# Patient Record
Sex: Female | Born: 1940 | Race: Black or African American | Hispanic: No | Marital: Married | Smoking: Former smoker
Health system: Southern US, Community
[De-identification: ages and names within clinical notes are randomized; demographics above are authoritative.]

## PROBLEM LIST (undated history)

## (undated) ENCOUNTER — Emergency Department: Admission: EM | Discharge status: Absent without leave | Payer: Medicare Other

## (undated) DIAGNOSIS — K5790 Diverticulosis of intestine, part unspecified, without perforation or abscess without bleeding: Secondary | ICD-10-CM

## (undated) DIAGNOSIS — C787 Secondary malignant neoplasm of liver and intrahepatic bile duct: Secondary | ICD-10-CM

## (undated) DIAGNOSIS — I10 Essential (primary) hypertension: Secondary | ICD-10-CM

## (undated) DIAGNOSIS — K7689 Other specified diseases of liver: Secondary | ICD-10-CM

## (undated) DIAGNOSIS — Z66 Do not resuscitate: Secondary | ICD-10-CM

## (undated) HISTORY — DX: Secondary malignant neoplasm of liver and intrahepatic bile duct: C78.7

## (undated) HISTORY — DX: Diverticulosis of intestine, part unspecified, without perforation or abscess without bleeding: K57.90

## (undated) HISTORY — PX: CATARACT EXTRACTION: SUR2

## (undated) HISTORY — PX: POLYPECTOMY: SHX149

## (undated) HISTORY — PX: ABDOMINAL HYSTERECTOMY: SHX81

## (undated) HISTORY — DX: Essential (primary) hypertension: I10

## (undated) HISTORY — DX: Do not resuscitate: Z66

## (undated) HISTORY — PX: COLONOSCOPY: SHX174

## (undated) HISTORY — DX: Other specified diseases of liver: K76.89

---

## 1963-10-23 HISTORY — PX: ECTOPIC PREGNANCY SURGERY: SHX613

## 1968-10-22 HISTORY — PX: OTHER SURGICAL HISTORY: SHX169

## 1999-08-31 ENCOUNTER — Encounter: Payer: Self-pay | Admitting: Family Medicine

## 1999-08-31 ENCOUNTER — Encounter: Admission: RE | Admit: 1999-08-31 | Discharge: 1999-08-31 | Payer: Self-pay | Admitting: Family Medicine

## 2001-10-30 ENCOUNTER — Other Ambulatory Visit: Admission: RE | Admit: 2001-10-30 | Discharge: 2001-10-30 | Payer: Self-pay | Admitting: Family Medicine

## 2002-05-12 ENCOUNTER — Encounter: Admission: RE | Admit: 2002-05-12 | Discharge: 2002-05-12 | Payer: Self-pay | Admitting: Family Medicine

## 2002-05-12 ENCOUNTER — Encounter: Payer: Self-pay | Admitting: Family Medicine

## 2002-06-23 ENCOUNTER — Encounter: Payer: Self-pay | Admitting: Emergency Medicine

## 2002-06-23 ENCOUNTER — Emergency Department (HOSPITAL_COMMUNITY): Admission: EM | Admit: 2002-06-23 | Discharge: 2002-06-24 | Payer: Self-pay | Admitting: Emergency Medicine

## 2003-09-09 ENCOUNTER — Encounter: Admission: RE | Admit: 2003-09-09 | Discharge: 2003-09-09 | Payer: Self-pay | Admitting: Orthopedic Surgery

## 2003-10-28 ENCOUNTER — Other Ambulatory Visit: Admission: RE | Admit: 2003-10-28 | Discharge: 2003-10-28 | Payer: Self-pay | Admitting: Family Medicine

## 2003-11-18 ENCOUNTER — Encounter: Admission: RE | Admit: 2003-11-18 | Discharge: 2003-11-18 | Payer: Self-pay | Admitting: Family Medicine

## 2003-12-15 ENCOUNTER — Encounter: Admission: RE | Admit: 2003-12-15 | Discharge: 2003-12-15 | Payer: Self-pay | Admitting: Family Medicine

## 2004-06-20 ENCOUNTER — Encounter: Admission: RE | Admit: 2004-06-20 | Discharge: 2004-06-20 | Payer: Self-pay | Admitting: Family Medicine

## 2005-08-15 ENCOUNTER — Other Ambulatory Visit: Admission: RE | Admit: 2005-08-15 | Discharge: 2005-08-15 | Payer: Self-pay | Admitting: Family Medicine

## 2005-10-11 ENCOUNTER — Encounter: Admission: RE | Admit: 2005-10-11 | Discharge: 2005-10-11 | Payer: Self-pay | Admitting: Family Medicine

## 2006-03-15 ENCOUNTER — Encounter: Admission: RE | Admit: 2006-03-15 | Discharge: 2006-03-15 | Payer: Self-pay | Admitting: Orthopedic Surgery

## 2006-03-20 ENCOUNTER — Encounter: Admission: RE | Admit: 2006-03-20 | Discharge: 2006-03-20 | Payer: Self-pay | Admitting: Orthopedic Surgery

## 2006-06-20 ENCOUNTER — Ambulatory Visit: Payer: Self-pay | Admitting: Internal Medicine

## 2006-07-31 ENCOUNTER — Encounter (INDEPENDENT_AMBULATORY_CARE_PROVIDER_SITE_OTHER): Payer: Self-pay | Admitting: *Deleted

## 2006-07-31 ENCOUNTER — Ambulatory Visit: Payer: Self-pay | Admitting: Internal Medicine

## 2006-10-16 ENCOUNTER — Encounter: Admission: RE | Admit: 2006-10-16 | Discharge: 2006-10-16 | Payer: Self-pay | Admitting: Family Medicine

## 2007-10-20 ENCOUNTER — Encounter: Admission: RE | Admit: 2007-10-20 | Discharge: 2007-10-20 | Payer: Self-pay | Admitting: Family Medicine

## 2008-10-20 ENCOUNTER — Encounter: Admission: RE | Admit: 2008-10-20 | Discharge: 2008-10-20 | Payer: Self-pay | Admitting: Family Medicine

## 2009-09-01 ENCOUNTER — Encounter: Admission: RE | Admit: 2009-09-01 | Discharge: 2009-09-01 | Payer: Self-pay | Admitting: Internal Medicine

## 2009-11-23 ENCOUNTER — Encounter: Admission: RE | Admit: 2009-11-23 | Discharge: 2009-11-23 | Payer: Self-pay | Admitting: Anesthesiology

## 2010-11-11 ENCOUNTER — Other Ambulatory Visit: Payer: Self-pay | Admitting: Internal Medicine

## 2010-11-11 DIAGNOSIS — Z1239 Encounter for other screening for malignant neoplasm of breast: Secondary | ICD-10-CM

## 2010-11-24 ENCOUNTER — Ambulatory Visit
Admission: RE | Admit: 2010-11-24 | Discharge: 2010-11-24 | Disposition: A | Discharge status: Home / Self Care | Payer: Federal, State, Local not specified - PPO | Source: Ambulatory Visit | Attending: Internal Medicine | Admitting: Internal Medicine

## 2010-11-24 DIAGNOSIS — Z1239 Encounter for other screening for malignant neoplasm of breast: Secondary | ICD-10-CM

## 2011-03-09 NOTE — Assessment & Plan Note (Signed)
[facility]                           GASTROENTEROLOGY OFFICE NOTE   Katherine Pham, Katherine Pham                         MRN:          [id number]  DATE:06/20/2006                            DOB:          July 10, 1941    REASON FOR EVALUATION:  Surveillance colonoscopy in an insulin requiring  diabetic.   HISTORY OF PRESENT ILLNESS:  This is a pleasant 70 year old African American  female with a history of hypertension, insulin-requiring diabetes,  hyperlipidemia, arthritis and adenomatous colon polyps who presents today  regarding surveillance colonoscopy.  She initially underwent colonoscopy in  August 2003 for family history of colon cancer in her mother.  That  examination revealed left sided diverticulosis as well as an ascending colon  polyp which was removed and found to be a tubular adenoma.  Follow up in  there years recommended.  Recall letter sent out earlier this summer to  which the patient has responded.  She has been doing well without any GI  complaints.  No abdominal pain, change in bowel habits or rectal bleeding.  Her general medical problems have been stable.   PAST MEDICAL HISTORY:  As above.   PAST SURGICAL HISTORY:  1. Tubal ligation.  2. Appendectomy.  3. Hysterectomy.  4. Breast surgery.   ALLERGIES:  SULFA.   CURRENT MEDICATIONS:  1. Hydrochlorothiazide 25 mg daily.  2. Lipitor 10 mg daily.  3. Zestril 20 mg daily.  4. Premarin 0.625 mg daily.  5. Allegra 180 mg daily as needed.  6. Humulin insulin 45 units subcu q.a.m. and q.p.m.  7. Diazepam p.r.n.   FAMILY HISTORY:  Mother with colon cancer at age 75, two sisters with colon  polyps.  Father with lung cancer.   SOCIAL HISTORY:  The patient is married with three children.  She cares for  her husband who has been ill.  She is retired from [employer].  Smokes a pack of cigarettes per day.  Does not use alcohol.   REVIEW OF SYSTEMS:  Per diagnostic evaluation  form.   PHYSICAL EXAMINATION:  GENERAL:  Well-appearing female, no acute distress.  VITAL SIGNS:  Blood pressure 130/82, heart rate 76, weight 144.2 pounds, 5  feet 4-1/2 inches in height.  HEENT:  Sclerae are anicteric.  Conjunctivae are pink.  Oral mucosa reveals  tobacco stained tongue.  LUNGS:  Rare rhonchi which are scattered.  HEART:  Regular.  ABDOMEN:  Soft without tenderness, mass or hernia.  Previous surgical  incision in the right abdomen is well-healed.  EXTREMITIES:  Without edema.   IMPRESSION:  This is a 70 year old with multiple general problems including  insulin-requiring diabetes, who presents today regarding surveillance  colonoscopy.  This, for a personal history of adenomatous colon polyps.  Also, family history of colon cancer in a first degree relative.  She is an  appropriate candidate without contraindication.   RECOMMENDATIONS:  1. Colonoscopy with polypectomy as necessary.  The nature of the procedure      as well as risks, benefits and alternatives have been reviewed.  She      understood  and agreed to proceed.  2. Hold a.m. insulin the day of the procedure.  Okay to take other      medications.  3. Ongoing general medical care with Dr. Artis Flock.                                   Wilhemina Bonito. Eda Keys., MD   JNP/MedQ  DD:  06/20/2006  DT:  06/21/2006  Job #:  [id number]   cc:   Quita Skye. Artis Flock, MD

## 2011-07-26 ENCOUNTER — Other Ambulatory Visit: Payer: Self-pay | Admitting: Internal Medicine

## 2011-07-26 DIAGNOSIS — M25552 Pain in left hip: Secondary | ICD-10-CM

## 2011-07-28 ENCOUNTER — Ambulatory Visit
Admission: RE | Admit: 2011-07-28 | Discharge: 2011-07-28 | Disposition: A | Discharge status: Home / Self Care | Payer: Federal, State, Local not specified - PPO | Source: Ambulatory Visit | Attending: Internal Medicine | Admitting: Internal Medicine

## 2011-07-28 DIAGNOSIS — M25552 Pain in left hip: Secondary | ICD-10-CM

## 2011-08-14 ENCOUNTER — Encounter: Payer: Self-pay | Admitting: Internal Medicine

## 2011-08-21 ENCOUNTER — Encounter: Payer: Self-pay | Admitting: Internal Medicine

## 2011-09-05 ENCOUNTER — Ambulatory Visit (AMBULATORY_SURGERY_CENTER): Payer: Medicare Other | Admitting: *Deleted

## 2011-09-05 VITALS — Ht 64.0 in | Wt 137.0 lb

## 2011-09-05 DIAGNOSIS — Z1211 Encounter for screening for malignant neoplasm of colon: Secondary | ICD-10-CM

## 2011-09-05 MED ORDER — MOVIPREP 100 G PO SOLR: ORAL | Status: DC

## 2011-09-26 ENCOUNTER — Ambulatory Visit (AMBULATORY_SURGERY_CENTER): Payer: Medicare Other | Admitting: Internal Medicine

## 2011-09-26 ENCOUNTER — Other Ambulatory Visit: Payer: Self-pay | Admitting: Internal Medicine

## 2011-09-26 ENCOUNTER — Encounter: Payer: Self-pay | Admitting: Internal Medicine

## 2011-09-26 VITALS — BP 168/94 | HR 89 | Temp 97.3°F | Resp 17 | Ht 64.0 in | Wt 137.0 lb

## 2011-09-26 DIAGNOSIS — D126 Benign neoplasm of colon, unspecified: Secondary | ICD-10-CM

## 2011-09-26 DIAGNOSIS — Z1211 Encounter for screening for malignant neoplasm of colon: Secondary | ICD-10-CM

## 2011-09-26 DIAGNOSIS — Z800 Family history of malignant neoplasm of digestive organs: Secondary | ICD-10-CM

## 2011-09-26 DIAGNOSIS — Z86010 Personal history of colonic polyps: Secondary | ICD-10-CM

## 2011-09-26 DIAGNOSIS — K5289 Other specified noninfective gastroenteritis and colitis: Secondary | ICD-10-CM

## 2011-09-26 MED ORDER — SODIUM CHLORIDE 0.9 % IV SOLN: 500.0000 mL | INTRAVENOUS | Status: DC

## 2011-09-26 NOTE — Patient Instructions (Signed)
Please review discharge instructions (blue and green sheets)  Await pathology  Resume your normal medications 

## 2011-09-26 NOTE — Progress Notes (Signed)
Upon arrival to [facility], pt vomited approximately 30 mL of green emesis.  Pt weepy and c/o left sided abd pain.  She is passing air.  Dr. Marina Goodell into see pt and assess.  1145- No further c/o nausea, smiling, passing air without difficulty.  No c/o pain.  Patient did not experience any of the following events: a burn prior to discharge; a fall within the facility; wrong site/side/patient/procedure/implant event; or a hospital transfer or hospital admission upon discharge from the facility. [phone])   Patient did not have preoperative order for IV antibiotic SSI prophylaxis. [phone])   Pt has no complaints at discharge.

## 2011-09-27 ENCOUNTER — Telehealth: Payer: Self-pay | Admitting: *Deleted

## 2011-09-27 NOTE — Telephone Encounter (Signed)

## 2011-10-29 ENCOUNTER — Other Ambulatory Visit: Payer: Self-pay | Admitting: Internal Medicine

## 2011-10-29 DIAGNOSIS — Z1231 Encounter for screening mammogram for malignant neoplasm of breast: Secondary | ICD-10-CM

## 2011-11-26 ENCOUNTER — Ambulatory Visit
Admission: RE | Admit: 2011-11-26 | Discharge: 2011-11-26 | Disposition: A | Discharge status: Home / Self Care | Payer: Medicare Other | Source: Ambulatory Visit | Attending: Internal Medicine | Admitting: Internal Medicine

## 2011-11-26 DIAGNOSIS — Z1231 Encounter for screening mammogram for malignant neoplasm of breast: Secondary | ICD-10-CM

## 2012-11-04 ENCOUNTER — Other Ambulatory Visit: Payer: Self-pay | Admitting: Internal Medicine

## 2012-11-04 DIAGNOSIS — Z1231 Encounter for screening mammogram for malignant neoplasm of breast: Secondary | ICD-10-CM

## 2012-12-05 ENCOUNTER — Ambulatory Visit: Payer: Medicare Other

## 2013-01-05 ENCOUNTER — Ambulatory Visit: Payer: Medicare Other

## 2013-02-05 ENCOUNTER — Ambulatory Visit
Admission: RE | Admit: 2013-02-05 | Discharge: 2013-02-05 | Disposition: A | Discharge status: Home / Self Care | Payer: Medicare Other | Source: Ambulatory Visit | Attending: Internal Medicine | Admitting: Internal Medicine

## 2013-02-05 DIAGNOSIS — Z1231 Encounter for screening mammogram for malignant neoplasm of breast: Secondary | ICD-10-CM

## 2014-01-05 ENCOUNTER — Other Ambulatory Visit: Payer: Self-pay

## 2014-01-05 DIAGNOSIS — Z1231 Encounter for screening mammogram for malignant neoplasm of breast: Secondary | ICD-10-CM

## 2014-02-08 ENCOUNTER — Ambulatory Visit
Admission: RE | Admit: 2014-02-08 | Discharge: 2014-02-08 | Disposition: A | Discharge status: Home / Self Care | Payer: Medicare Other | Source: Ambulatory Visit

## 2014-02-08 DIAGNOSIS — Z1231 Encounter for screening mammogram for malignant neoplasm of breast: Secondary | ICD-10-CM

## 2015-01-05 ENCOUNTER — Other Ambulatory Visit: Payer: Self-pay

## 2015-01-05 DIAGNOSIS — Z1231 Encounter for screening mammogram for malignant neoplasm of breast: Secondary | ICD-10-CM

## 2015-02-10 ENCOUNTER — Ambulatory Visit
Admission: RE | Admit: 2015-02-10 | Discharge: 2015-02-10 | Disposition: A | Discharge status: Home / Self Care | Payer: Medicare Other | Source: Ambulatory Visit

## 2015-02-10 DIAGNOSIS — Z1231 Encounter for screening mammogram for malignant neoplasm of breast: Secondary | ICD-10-CM

## 2015-03-29 ENCOUNTER — Encounter: Payer: Self-pay | Admitting: Internal Medicine

## 2015-10-05 ENCOUNTER — Other Ambulatory Visit: Payer: Self-pay | Admitting: Internal Medicine

## 2015-10-05 DIAGNOSIS — R918 Other nonspecific abnormal finding of lung field: Secondary | ICD-10-CM

## 2015-10-06 ENCOUNTER — Ambulatory Visit
Admission: RE | Admit: 2015-10-06 | Discharge: 2015-10-06 | Disposition: A | Discharge status: Home / Self Care | Payer: Medicare Other | Source: Ambulatory Visit | Attending: Internal Medicine | Admitting: Internal Medicine

## 2015-10-06 DIAGNOSIS — R918 Other nonspecific abnormal finding of lung field: Secondary | ICD-10-CM

## 2015-10-06 MED ORDER — IOPAMIDOL (ISOVUE-300) INJECTION 61%
75.0000 mL | Freq: Once | INTRAVENOUS | Status: AC | PRN
  Administered 2015-10-06: 75 mL via INTRAVENOUS

## 2015-10-12 ENCOUNTER — Telehealth: Payer: Self-pay | Admitting: *Deleted

## 2015-10-12 DIAGNOSIS — R918 Other nonspecific abnormal finding of lung field: Secondary | ICD-10-CM | POA: Insufficient documentation

## 2015-10-12 NOTE — Telephone Encounter (Signed)
Patient called to get clarification of appt.  She also needed an earlier time due to not being able to drive in the dark. I changed the appt and clarified with her.

## 2015-10-12 NOTE — Telephone Encounter (Signed)
Oncology Nurse Navigator Documentation  Oncology Nurse Navigator Flowsheets 10/12/2015  Referral date to RadOnc/MedOnc 10/11/2015  Navigator Encounter Type Telephone;Introductory phone call/I received referral on Katherine Pham.  I called and left her a vm message regarding appt to see Dr. Julien Nordmann on 10/20/15 arrive at cancer center at 3:30.  I also left my name and phone number to call if needed.   Patient Visit Type Initial  Treatment Phase Abnormal Scans  Interventions Coordination of Care  Coordination of Care MD Appointments  Time Spent with Patient 15

## 2015-10-19 ENCOUNTER — Telehealth: Payer: Self-pay | Admitting: *Deleted

## 2015-10-19 NOTE — Telephone Encounter (Signed)
Called pt and confirmed 10/20/15 clinic appt w/ her.

## 2015-10-20 ENCOUNTER — Telehealth: Payer: Self-pay | Admitting: Internal Medicine

## 2015-10-20 ENCOUNTER — Encounter: Payer: Self-pay | Admitting: Internal Medicine

## 2015-10-20 ENCOUNTER — Ambulatory Visit (HOSPITAL_BASED_OUTPATIENT_CLINIC_OR_DEPARTMENT_OTHER): Payer: Medicare Other | Admitting: Internal Medicine

## 2015-10-20 ENCOUNTER — Ambulatory Visit: Payer: Medicare Other | Attending: Internal Medicine | Admitting: Physical Therapy

## 2015-10-20 ENCOUNTER — Encounter: Payer: Self-pay | Admitting: *Deleted

## 2015-10-20 ENCOUNTER — Other Ambulatory Visit (HOSPITAL_BASED_OUTPATIENT_CLINIC_OR_DEPARTMENT_OTHER): Payer: Medicare Other

## 2015-10-20 VITALS — BP 146/69 | HR 98 | Temp 97.9°F | Resp 18 | Ht 64.0 in | Wt 132.2 lb

## 2015-10-20 DIAGNOSIS — E119 Type 2 diabetes mellitus without complications: Secondary | ICD-10-CM

## 2015-10-20 DIAGNOSIS — R918 Other nonspecific abnormal finding of lung field: Secondary | ICD-10-CM

## 2015-10-20 DIAGNOSIS — J449 Chronic obstructive pulmonary disease, unspecified: Secondary | ICD-10-CM | POA: Diagnosis not present

## 2015-10-20 DIAGNOSIS — I10 Essential (primary) hypertension: Secondary | ICD-10-CM

## 2015-10-20 DIAGNOSIS — R5381 Other malaise: Secondary | ICD-10-CM | POA: Diagnosis present

## 2015-10-20 DIAGNOSIS — N189 Chronic kidney disease, unspecified: Secondary | ICD-10-CM

## 2015-10-20 DIAGNOSIS — C3492 Malignant neoplasm of unspecified part of left bronchus or lung: Secondary | ICD-10-CM | POA: Diagnosis not present

## 2015-10-20 DIAGNOSIS — C787 Secondary malignant neoplasm of liver and intrahepatic bile duct: Secondary | ICD-10-CM

## 2015-10-20 DIAGNOSIS — E559 Vitamin D deficiency, unspecified: Secondary | ICD-10-CM

## 2015-10-20 DIAGNOSIS — E785 Hyperlipidemia, unspecified: Secondary | ICD-10-CM | POA: Insufficient documentation

## 2015-10-20 DIAGNOSIS — E0800 Diabetes mellitus due to underlying condition with hyperosmolarity without nonketotic hyperglycemic-hyperosmolar coma (NKHHC): Secondary | ICD-10-CM

## 2015-10-20 DIAGNOSIS — M6281 Muscle weakness (generalized): Secondary | ICD-10-CM

## 2015-10-20 HISTORY — DX: Secondary malignant neoplasm of liver and intrahepatic bile duct: C78.7

## 2015-10-20 LAB — CBC WITH DIFFERENTIAL/PLATELET
BASO%: 0.4 % (ref 0.0–2.0)
Basophils Absolute: 0 10*3/uL (ref 0.0–0.1)
EOS%: 2.6 % (ref 0.0–7.0)
Eosinophils Absolute: 0.2 10*3/uL (ref 0.0–0.5)
HCT: 45.2 % (ref 34.8–46.6)
HEMOGLOBIN: 15.1 g/dL (ref 11.6–15.9)
LYMPH#: 1.8 10*3/uL (ref 0.9–3.3)
LYMPH%: 21.9 % (ref 14.0–49.7)
MCH: 30.3 pg (ref 25.1–34.0)
MCHC: 33.4 g/dL (ref 31.5–36.0)
MCV: 90.8 fL (ref 79.5–101.0)
MONO#: 0.6 10*3/uL (ref 0.1–0.9)
MONO%: 6.9 % (ref 0.0–14.0)
NEUT#: 5.5 10*3/uL (ref 1.5–6.5)
NEUT%: 68.2 % (ref 38.4–76.8)
Platelets: 215 10*3/uL (ref 145–400)
RBC: 4.98 10*6/uL (ref 3.70–5.45)
RDW: 15.3 % — ABNORMAL HIGH (ref 11.2–14.5)
WBC: 8.1 10*3/uL (ref 3.9–10.3)

## 2015-10-20 LAB — COMPREHENSIVE METABOLIC PANEL
ALT: 24 U/L (ref 0–55)
AST: 37 U/L — AB (ref 5–34)
Albumin: 3.6 g/dL (ref 3.5–5.0)
Alkaline Phosphatase: 95 U/L (ref 40–150)
Anion Gap: 9 mEq/L (ref 3–11)
BUN: 16.7 mg/dL (ref 7.0–26.0)
CALCIUM: 10.3 mg/dL (ref 8.4–10.4)
CHLORIDE: 107 meq/L (ref 98–109)
CO2: 22 meq/L (ref 22–29)
CREATININE: 0.9 mg/dL (ref 0.6–1.1)
EGFR: 77 mL/min/{1.73_m2} — ABNORMAL LOW (ref >90)
Glucose: 200 mg/dl — ABNORMAL HIGH (ref 70–140)
Potassium: 4.3 mEq/L (ref 3.5–5.1)
Sodium: 138 mEq/L (ref 136–145)
Total Bilirubin: 0.47 mg/dL (ref 0.20–1.20)
Total Protein: 7.2 g/dL (ref 6.4–8.3)

## 2015-10-20 NOTE — Progress Notes (Signed)
[facility] Telephone:([phone]   [phone] Multidisciplinary thoracic oncology clinic  CONSULT NOTE  REFERRING PHYSICIAN: Dr. Trilby Drummer  REASON FOR CONSULTATION:  74 years old African-American female with questionable metastatic lung cancer  HPI Katherine Pham is a 74 y.o. female with long history of smoking and past medical history significant for diabetes mellitus, hypertension, chronic renal failure, dyslipidemia, COPD, vitamin D deficiency as well as allergic rhinitis. The patient was seen by her primary care physician 2 weeks ago for routine evaluation and diabetes management. During her visit she complained of 1-2 weeks of intermittent left sided chest pain. Chest x-ray was performed on 10/05/2015 and it showed a mass in the posterior segment of the left upper lobe and another mass in the anterior segment of the left upper lobe. CT scan of the chest was performed on 10/06/2015 and it showed a dominant cavitay pleural based 5 0.2 x 3.3 cm left lower lobe lung mass concerning for primary bronchogenic carcinoma. There is also a pleural-based 3.1 x 1.8 cm anterior left upper lobe lung mass and a spiculated 1.6 cm apical right upper lobe pulmonary nodule. This could represent synchronous primary bronchogenic carcinoma versus pulmonary metastasis. There was also 3 additional subcentimeter right upper lobe pulmonary nodules likely pulmonary metastasis. The patient had right paratracheal, subcarinal and AP window mediastinal lymphadenopathy in addition to bilateral hilar adenopathy, likely metastatic. There was also innumerable more than 20 liver masses with peripheral hyperenhancement consistent with liver metastasis. The patient also has a right adrenal nodule likely metastatic in nature. Dr. Noah Delaine kindly referred the patient to me today for evaluation and recommendation regarding treatment of her condition. When seen today the patient continues to have  intermittent left-sided chest pain. She has mild nonproductive cough related to postnasal drainage. She denied having any significant shortness breath or hemoptysis. No significant weight loss but she has hot flashes. She denied having any headache or visual changes. The patient has mild nausea with no vomiting, diarrhea or constipation. She complains today of sleep disturbance that has been going on for a while.  Family history significant for a father who died at age 11 with a small cell lung cancer. Mother died from colon cancer and Alzheimer. She also had a sister with metastatic disease to the brain and bone.  The patient is a widow and has 3 children. One of them had mental issues and currently lives in group home and [facility]. She has 2 daughters one lives in [place] and the other lives in [place]. She used to do office work for [employer]. She has a history of smoking 1 pack per day for around 58 years and quit last week. She denied having any history of alcohol or drug abuse.  HPI  Past Medical History  Diagnosis Date  . Hypertension   . Diabetes mellitus   . Diverticulosis     on colon-07/31/06  . Liver metastases ([place]) 10/20/2015    Past Surgical History  Procedure Laterality Date  . Abdominal hysterectomy    . Ectopic pregnancy surgery  1965  . Cataract extraction      lt eye 1996,rt eye 1996  . Breast cyst removal  1970    bilateral benign  . Colonoscopy    . Polypectomy      Family History  Problem Relation Age of Onset  . Colon cancer Mother   . Diabetes Sister   . Diabetes Brother   . Diabetes Maternal  Grandmother   . Diabetes Sister     Social History Social History  Substance Use Topics  . Smoking status: Current Some Day Smoker    Types: Cigarettes  . Smokeless tobacco: Never Used  . Alcohol Use: No    Allergies  Allergen Reactions  . Naproxen Hives  . Sulfa Antibiotics Hives  . Celebrex [Celecoxib] Other (See  Comments)    Causes stomach cramps    Current Outpatient Prescriptions  Medication Sig Dispense Refill  . amLODipine (NORVASC) 2.5 MG tablet Take 2.5 mg by mouth daily.  0  . Artificial Tear Ointment (ARTIFICIAL TEARS) ointment as needed.      . BD INSULIN SYRINGE ULTRAFINE 31G X 15/64" 0.5 ML MISC     . cetirizine (ZYRTEC) 10 MG tablet Take 10 mg by mouth as needed.      . ergocalciferol (VITAMIN D2) 50000 UNITS capsule     . estrogens, conjugated, (PREMARIN) 0.625 MG tablet Take 0.625 mg by mouth daily. Take daily for 21 days then do not take for 7 days.     . hydrochlorothiazide (HYDRODIURIL) 25 MG tablet Take 25 mg by mouth Daily.    . insulin NPH (HUMULIN N,NOVOLIN N) 100 UNIT/ML injection Inject 100 Units into the skin 2 (two) times daily before a meal. Pt reports 50units in am, 35units in PM    . lisinopril (PRINIVIL,ZESTRIL) 40 MG tablet Take 40 mg by mouth daily.      . meloxicam (MOBIC) 7.5 MG tablet 1 tablet Once a day Orally 90 days  0  . ONE TOUCH ULTRA TEST test strip     . simvastatin (ZOCOR) 10 MG tablet Take 10 mg by mouth at bedtime.      . triamcinolone cream (KENALOG) 0.1 % Apply topically 2 (two) times daily.       No current facility-administered medications for this visit.    Review of Systems  Constitutional: positive for fatigue Eyes: negative Ears, nose, mouth, throat, and face: positive for Postnasal drainage Respiratory: positive for cough and pleurisy/chest pain Cardiovascular: negative Gastrointestinal: negative Genitourinary:negative Integument/breast: negative Hematologic/lymphatic: negative Musculoskeletal:negative Neurological: negative Behavioral/Psych: negative Endocrine: negative Allergic/Immunologic: negative  Physical Exam  VEL:FYBOF, healthy, no distress, well nourished, well developed and anxious SKIN: skin color, texture, turgor are normal, no rashes or significant lesions HEAD: Normocephalic, No masses, lesions, tenderness or  abnormalities EYES: normal, PERRLA, Conjunctiva are pink and non-injected EARS: External ears normal, Canals clear OROPHARYNX:no exudate, no erythema and lips, buccal mucosa, and tongue normal  NECK: supple, no adenopathy, no JVD LYMPH:  no palpable lymphadenopathy, no hepatosplenomegaly BREAST:not examined LUNGS: prolonged expiratory phase, expiratory wheezes bilaterally HEART: regular rate & rhythm, no murmurs and no gallops ABDOMEN:abdomen soft, non-tender, normal bowel sounds and no masses or organomegaly BACK: Back symmetric, no curvature., No CVA tenderness EXTREMITIES:no joint deformities, effusion, or inflammation, no edema, no skin discoloration, no clubbing  NEURO: alert & oriented x 3 with fluent speech, no focal motor/sensory deficits  PERFORMANCE STATUS: ECOG 1  LABORATORY DATA: Lab Results  Component Value Date   WBC 8.1 10/20/2015   HGB 15.1 10/20/2015   HCT 45.2 10/20/2015   MCV 90.8 10/20/2015   PLT 215 10/20/2015      Chemistry      Component Value Date/Time   NA 138 10/20/2015 1208   K 4.3 10/20/2015 1208   CO2 22 10/20/2015 1208   BUN 16.7 10/20/2015 1208   CREATININE 0.9 10/20/2015 1208      Component Value Date/Time  CALCIUM 10.3 10/20/2015 1208   ALKPHOS 95 10/20/2015 1208   AST 37* 10/20/2015 1208   ALT 24 10/20/2015 1208   BILITOT 0.47 10/20/2015 1208       RADIOGRAPHIC STUDIES: Ct Chest W Contrast  10/06/2015  CLINICAL DATA:  Cavitary left lung mass on recent chest radiograph. Current smoker. EXAM: CT CHEST WITH CONTRAST TECHNIQUE: Multidetector CT imaging of the chest was performed during intravenous contrast administration. CONTRAST:  46m ISOVUE-300 IOPAMIDOL (ISOVUE-300) INJECTION 61% COMPARISON:  Chest radiograph from one day prior. 09/01/2009 chest CT. FINDINGS: Mediastinum/Nodes: Normal heart size. Trace pericardial fluid/thickening. Great vessels are normal in course and caliber. No central pulmonary emboli. Normal visualized thyroid.  Normal esophagus. No axillary adenopathy. There are several mildly enlarged right paratracheal nodes, largest 1.3 cm (series 2/ image 22). There is a mildly enlarged 1.1 cm subcarinal node (2/25). There is a mildly enlarged 1.3 cm prevascular left anterior mediastinal node (2/17). There is bulky aortopulmonary window lymphadenopathy, largest 2.2 cm (2/21). There is a mildly enlarged 1.2 cm left lower lobe peribronchial node (2/28). There is a mildly enlarged 1.1 cm right hilar node (2/26). Lungs/Pleura: No pneumothorax. No pleural effusion. There is a pleural-based 5.2 x 3.3 cm lobulated left lower lobe lung mass with partial internal cavitation (series 5/ image 29). There is a pleural-based lobulated 3.1 x 1.8 cm anterior left upper lobe lung mass (5/21). There is a spiculated 1.6 x 1.5 cm anterior apical right upper lobe pulmonary nodule (5/11). There are 3 additional subcentimeter solid right upper lobe pulmonary nodules, largest 0.9 cm (5/18). There is moderate centrilobular and paraseptal emphysema. No acute consolidative airspace disease. Upper abdomen: There are innumerable (> than 20) hypodense liver masses scattered throughout the visualized liver, each of which demonstrates central hypoenhancement and a thickened hyperenhancing rim, largest 2.1 cm in segment 8 of the right liver lobe (series 2/ image 46). Right adrenal 1.0 cm nodule (series 2/image 51), which appears new since 20/10. Mild irregularity of the left adrenal gland, without discrete left adrenal nodule. Musculoskeletal: No aggressive appearing focal osseous lesions. Mild degenerative changes in the thoracic spine. IMPRESSION: 1. Dominant cavitary 5.2 x 3.3 cm left lower lobe lung mass, pleural-based, most in keeping with a primary bronchogenic carcinoma. Thoracic surgical and oncology consultation are advised. PET-CT would be useful for further staging. 2. Pleural-based 3.1 x 1.8 cm anterior left upper lobe lung mass. Spiculated 1.6 cm apical  right upper lobe pulmonary nodule. These could represent synchronous primary bronchogenic carcinomas versus pulmonary metastases. 3. Three additional subcentimeter right upper lobe pulmonary nodules, likely pulmonary metastases. 4. Right paratracheal, subcarinal and AP window mediastinal lymphadenopathy and bilateral hilar lymphadenopathy, likely metastatic. 5. Innumerable (> than 20) liver masses with peripheral hyperenhancement, in keeping with liver metastases. 6. Right adrenal nodule, likely metastatic. 7. Moderate emphysema. Electronically Signed   By: JIlona SorrelM.D.   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 Tissue biopsy and further staging workup are still pending.   PLAN: I had a lengthy discussion with the patient today about her current disease stage, prognosis and treatment options. I showed the patient the images of the CT scan of the chest. I recommended for the patient to complete the staging workup by ordering a PET scan in addition to MRI  of the brain. I will also refer the patient to interventional radiology for consideration of ultrasound guided core biopsy of one of the metastatic liver lesion. We will try to send her tissue to be tested for PDL 1 The patient if it is consistent with non-small cell lung cancer and also to [facility] for molecular study if it is consistent with adenocarcinoma. I will arrange for the patient to come back for follow-up visit in 2 weeks for reevaluation and discussion of her treatment options based on the final pathology and staging workup For smoke cessation, I strongly encouraged the patient to continue quitting smoking and offered her smoke cessation program and help if needed. For malnutrition, I will refer  the patient to the dietitian at the [facility] for further evaluation and recommendation regarding management of her diet especially with her previous history of diabetes mellitus For the history of hypertension and diabetes mellitus, the patient will continue her routine follow-up visit and management by her primary care physician. I give the patient the time to ask questions and I answered them completely to her satisfaction. The patient was seen during the multidisciplinary thoracic oncology clinic today by medical oncology, physical therapist, thoracic navigator and social worker. She was advised to call immediately if she has any concerning symptoms in the interval. The patient voices understanding of current disease status and treatment options and is in agreement with the current care plan.  All questions were answered. The patient knows to call the clinic with any problems, questions or concerns. We can certainly see the patient much sooner if necessary.  Thank you so much for allowing me to participate in the care of Katherine Pham. I will continue to follow up the patient with you and assist in her care.  I spent 55 minutes counseling the patient face to face. The total time spent in the appointment was 80 minutes.  Disclaimer: This note was dictated with voice recognition software. Similar sounding words can inadvertently be transcribed and may not be corrected upon review.   Briauna Gilmartin K. October 20, 2015, 2:01 PM

## 2015-10-20 NOTE — Telephone Encounter (Signed)
Gave pt appts for 11/01/15 and advised her that radiology will call her to arrange appts for US Biopsy, Pet Scan, and MRI Brain asap.

## 2015-10-20 NOTE — Therapy (Signed)
[place], [place], [address] Phone: [phone]   Fax:  [phone]  Physical Therapy Evaluation  Patient Details  Name: AUDA FINFROCK MRN: [id number] Date of Birth: Jul 28, 1941 Referring Provider: Dr. Curt Bears  Encounter Date: 10/20/2015      PT End of Session - 10/20/15 1439    Visit Number 1   Number of Visits 1   PT Start Time 1660   PT Stop Time 1420   PT Time Calculation (min) 25 min   Activity Tolerance Patient tolerated treatment well      Past Medical History  Diagnosis Date  . Hypertension   . Diabetes mellitus   . Diverticulosis     on colon-07/31/06  . Liver metastases ([place]) 10/20/2015    Past Surgical History  Procedure Laterality Date  . Abdominal hysterectomy    . Ectopic pregnancy surgery  1965  . Cataract extraction      lt eye 1996,rt eye 1996  . Breast cyst removal  1970    bilateral benign  . Colonoscopy    . Polypectomy      There were no vitals filed for this visit.  Visit Diagnosis:  Physical deconditioning - Plan: PT plan of care cert/re-cert      Subjective Assessment - 10/20/15 1428    Subjective Patient reports being stressed today, and nervous as she signs her name.   Pertinent History Patient presented with chest pain for one week; work up 10/06/15 showed left lung mass.  No tissue diagnosis yet on left lower lobe 5 cm. mass and left upper lobe 3 cm. mass; also has mediastinal and hilar lymphadenopathy and liver metastases.  Expected to have chemotherapy.  IDDM; HTN; current smoker; right hip OA.   Patient Stated Goals get information from all lung clinic providers   Currently in Pain? No/denies   Pain Score 2   at times   Pain Location Rib cage   Pain Orientation Left   Pain Descriptors / Indicators Other (Comment)  "I'm aware of it."   Pain Frequency Intermittent   Pain Relieving Factors nothing            [facility] PT Assessment -  10/20/15 0001    Assessment   Medical Diagnosis left lung cancer, stage IV   Referring Provider Dr. Curt Bears   Onset Date/Surgical Date 10/06/15   Precautions   Precautions Other (comment)   Precaution Comments cancer precautions   Restrictions   Weight Bearing Restrictions No   Balance Screen   Has the patient fallen in the past 6 months No   Has the patient had a decrease in activity level because of a fear of falling?  No   Is the patient reluctant to leave their home because of a fear of falling?  No   Home Environment   Living Environment Private residence   Living Arrangements Alone  widow   Type of [place] One level   Prior Function   Level of Independence Independent   Leisure no current exercise program   Cognition   Overall Cognitive Status Within Functional Limits for tasks assessed   Functional Tests   Functional tests Sit to Stand   Sit to Stand   Comments 7 times in 30 seconds, below average for her age  reports some perspiration in face and fatigue in quads after   Posture/Postural Control   Posture/Postural Control No significant limitations   ROM /  Strength   AROM / PROM / Strength AROM   AROM   Overall AROM Comments standing trunk AROM WFL all motions   Ambulation/Gait   Ambulation/Gait Yes   Ambulation/Gait Assistance 7: Independent  per patient report   Assistive device None   Balance   Balance Assessed Yes   Dynamic Standing Balance   Dynamic Standing - Comments reaches 10 inches forward in standing, about average for her age           LYMPHEDEMA/ONCOLOGY QUESTIONNAIRE - 26-Oct-2015 1438    Type   Cancer Type lung, stage IV                        PT Education - 26-Oct-2015 1438    Education provided Yes   Education Details posture, breathing, energy conservation, walking, Cure article on staying active, PT info   Person(s) Educated Patient   Methods Explanation;Handout   Comprehension Verbalized  understanding               Lung Clinic Goals - 10-26-2015 1442    Patient will be able to verbalize understanding of the benefit of exercise to decrease fatigue.   Status Achieved   Patient will be able to verbalize the importance of posture.   Status Achieved   Patient will be able to demonstrate diaphragmatic breathing for improved lung function.   Status Achieved   Patient will be able to verbalize understanding of the role of physical therapy to prevent functional decline and who to contact if physical therapy is needed.   Status Achieved             Plan - 10-26-15 1440    Clinical Impression Statement Patient being diagnosed with stage IV lung cancer was attentive to information given today despite being stressed about receiving this diagnosis.  She was assured that breathing exercises can still help her, despite having lung disease.   Pt will benefit from skilled therapeutic intervention in order to improve on the following deficits Cardiopulmonary status limiting activity   Rehab Potential Good   PT Frequency One time visit   PT Treatment/Interventions Patient/family education   PT Next Visit Plan None at this time; patient may benefit from therapy going forward if she has difficulty with doing and exercise program or maintaining strength.   PT Home Exercise Plan see education section   Consulted and Agree with Plan of Care Patient          G-Codes - 10-26-2015 1443    Functional Assessment Tool Used clinical judgement   Functional Limitation Changing and maintaining body position   Changing and Maintaining Body Position Current Status [phone]) At least 20 percent but less than 40 percent impaired, limited or restricted   Changing and Maintaining Body Position Goal Status (X7353) At least 20 percent but less than 40 percent impaired, limited or restricted   Changing and Maintaining Body Position Discharge Status (G9924) At least 20 percent but less than 40 percent  impaired, limited or restricted       Problem List Patient Active Problem List   Diagnosis Date Noted  . HTN (hypertension) 2015-10-26  . DM (diabetes mellitus) ([place]) 2015/10/26  . Dyslipidemia 26-Oct-2015  . 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 Liver metastases ([place]) October 26, 2015  . Lung mass 10/12/2015    SALISBURY,DONNA 26-Oct-2015, 2:45 PM  [place], [place], [address] Phone: [phone]   Fax:  [phone]  Name: DEMA TIMMONS MRN: [id number] Date of Birth: [birth year]   [name], PT 10/20/2015 2:45 PM

## 2015-10-20 NOTE — Progress Notes (Signed)
[place] Clinical Social Work  Clinical Social Work met with patient at [facility] appointment to offer support and assess for psychosocial needs.  The patient scored a 5 on the Psychosocial Distress Thermometer which indicates moderate distress. Mrs. Digilio shared that she feels distress related to her recent cancer diagnosis, but feels she has "been blessed" in her life and compared her distress as minimal to what others may deal with.  Patient briefly shared feeling of depression she has dealt with "every so often" and relates anxiety and adjusting to illness as recent feelings since being diagnosed with cancer.  Mrs. Musso discussed the importance of her faith and shared she relies on reading the bible and her life experiences, but not a specific church/pastor.  CSW validated patients feelings and provided brief emotional support. Patient identified her long time friend from [place] to be her main emotional support and her next door neighbor as a support for practical needs such as transportation.  The patient has 2 daughters (one lives in [place] and one lives in [place]) and 1 son (he is mentally disabled and lives in group home in [place]).  She also has many siblings that live in [place].    ONCBCN DISTRESS SCREENING 10/20/2015  Screening Type Initial Screening  Distress experienced in past week (1-10) 5  Emotional problem type Depression;Nervousness/Anxiety;Adjusting to illness  Spiritual/Religous concerns type Facing my mortality  Physical Problem type Sleep/insomnia  Referral to clinical social work Yes   Clinical Social Work briefly discussed Clinical Social Work role and [employer] support programs/services.  Clinical Social Work encouraged patient to call with any additional questions or concerns.   Polo Riley, MSW, LCSW, OSW-C Clinical Social Worker [facility] [phone]

## 2015-10-20 NOTE — Progress Notes (Signed)
Oncology Nurse Navigator Documentation  Oncology Nurse Navigator Flowsheets 10/20/2015  Navigator Encounter Type Clinic/MDC/spoke with patient today.  I gave her information on next steps.  Patient will be scheduled for follow up  Patient Visit Type Initial  Treatment Phase Abnormal Scans  Barriers/Navigation Needs Education  Education Other  Time Spent with Patient 30

## 2015-10-25 ENCOUNTER — Other Ambulatory Visit: Payer: Self-pay | Admitting: Radiology

## 2015-10-25 ENCOUNTER — Telehealth: Payer: Self-pay | Admitting: Medical Oncology

## 2015-10-25 NOTE — Telephone Encounter (Signed)
I talked to pt to keep appts as scheduled.

## 2015-10-27 ENCOUNTER — Encounter (HOSPITAL_COMMUNITY): Payer: Self-pay

## 2015-10-27 ENCOUNTER — Ambulatory Visit (HOSPITAL_COMMUNITY)
Admission: RE | Admit: 2015-10-27 | Discharge: 2015-10-27 | Disposition: A | Discharge status: Home / Self Care | Payer: Medicare Other | Source: Ambulatory Visit | Attending: Internal Medicine | Admitting: Internal Medicine

## 2015-10-27 ENCOUNTER — Telehealth: Payer: Self-pay | Admitting: *Deleted

## 2015-10-27 DIAGNOSIS — R918 Other nonspecific abnormal finding of lung field: Secondary | ICD-10-CM

## 2015-10-27 DIAGNOSIS — I10 Essential (primary) hypertension: Secondary | ICD-10-CM | POA: Insufficient documentation

## 2015-10-27 DIAGNOSIS — R591 Generalized enlarged lymph nodes: Secondary | ICD-10-CM | POA: Diagnosis not present

## 2015-10-27 DIAGNOSIS — E278 Other specified disorders of adrenal gland: Secondary | ICD-10-CM | POA: Insufficient documentation

## 2015-10-27 DIAGNOSIS — K769 Liver disease, unspecified: Secondary | ICD-10-CM | POA: Diagnosis not present

## 2015-10-27 DIAGNOSIS — E785 Hyperlipidemia, unspecified: Secondary | ICD-10-CM

## 2015-10-27 DIAGNOSIS — C787 Secondary malignant neoplasm of liver and intrahepatic bile duct: Secondary | ICD-10-CM

## 2015-10-27 DIAGNOSIS — J449 Chronic obstructive pulmonary disease, unspecified: Secondary | ICD-10-CM

## 2015-10-27 DIAGNOSIS — E119 Type 2 diabetes mellitus without complications: Secondary | ICD-10-CM | POA: Diagnosis not present

## 2015-10-27 DIAGNOSIS — E0800 Diabetes mellitus due to underlying condition with hyperosmolarity without nonketotic hyperglycemic-hyperosmolar coma (NKHHC): Secondary | ICD-10-CM

## 2015-10-27 LAB — CBC WITH DIFFERENTIAL/PLATELET
BASOS ABS: 0 10*3/uL (ref 0.0–0.1)
Basophils Relative: 0 %
Eosinophils Absolute: 0.2 10*3/uL (ref 0.0–0.7)
Eosinophils Relative: 2 %
HEMATOCRIT: 44.3 % (ref 36.0–46.0)
HEMOGLOBIN: 14.5 g/dL (ref 12.0–15.0)
Lymphocytes Relative: 16 %
Lymphs Abs: 1.5 10*3/uL (ref 0.7–4.0)
MCH: 29.8 pg (ref 26.0–34.0)
MCHC: 32.7 g/dL (ref 30.0–36.0)
MCV: 91.2 fL (ref 78.0–100.0)
MONOS PCT: 9 %
Monocytes Absolute: 0.8 10*3/uL (ref 0.1–1.0)
NEUTROS ABS: 6.8 10*3/uL (ref 1.7–7.7)
NEUTROS PCT: 73 %
Platelets: 189 10*3/uL (ref 150–400)
RBC: 4.86 MIL/uL (ref 3.87–5.11)
RDW: 15.4 % (ref 11.5–15.5)
WBC: 9.4 10*3/uL (ref 4.0–10.5)

## 2015-10-27 LAB — GLUCOSE, CAPILLARY
GLUCOSE-CAPILLARY: 145 mg/dL — AB (ref 65–99)
Glucose-Capillary: 168 mg/dL — ABNORMAL HIGH (ref 65–99)

## 2015-10-27 LAB — APTT: aPTT: 29 seconds (ref 24–37)

## 2015-10-27 LAB — COMPREHENSIVE METABOLIC PANEL
ALBUMIN: 3.8 g/dL (ref 3.5–5.0)
ALK PHOS: 113 U/L (ref 38–126)
ALT: 50 U/L (ref 14–54)
AST: 64 U/L — AB (ref 15–41)
Anion gap: 14 (ref 5–15)
BILIRUBIN TOTAL: 0.8 mg/dL (ref 0.3–1.2)
BUN: 18 mg/dL (ref 6–20)
CO2: 18 mmol/L — ABNORMAL LOW (ref 22–32)
Calcium: 10.5 mg/dL — ABNORMAL HIGH (ref 8.9–10.3)
Chloride: 104 mmol/L (ref 101–111)
Creatinine, Ser: 0.6 mg/dL (ref 0.44–1.00)
GFR calc Af Amer: >60 mL/min (ref >60)
GFR calc non Af Amer: >60 mL/min (ref >60)
Glucose, Bld: 157 mg/dL — ABNORMAL HIGH (ref 65–99)
Potassium: 4.2 mmol/L (ref 3.5–5.1)
Sodium: 136 mmol/L (ref 135–145)
Total Protein: 7 g/dL (ref 6.5–8.1)

## 2015-10-27 LAB — PROTIME-INR
INR: 1.06 (ref 0.00–1.49)
Prothrombin Time: 14 seconds (ref 11.6–15.2)

## 2015-10-27 MED ORDER — PROCHLORPERAZINE MALEATE 10 MG PO TABS: 10.0000 mg | ORAL_TABLET | Freq: Four times a day (QID) | ORAL | Status: AC | PRN

## 2015-10-27 MED ORDER — SODIUM CHLORIDE 0.9 % IV SOLN
INTRAVENOUS | Status: DC
Start: 2015-10-27 — End: 2015-10-28
  Administered 2015-10-27: 12:00:00 via INTRAVENOUS

## 2015-10-27 MED ORDER — FENTANYL CITRATE (PF) 100 MCG/2ML IJ SOLN
INTRAMUSCULAR | Status: AC
  Filled 2015-10-27: qty 4

## 2015-10-27 MED ORDER — FENTANYL CITRATE (PF) 100 MCG/2ML IJ SOLN
INTRAMUSCULAR | Status: AC | PRN
  Administered 2015-10-27: 50 ug via INTRAVENOUS

## 2015-10-27 MED ORDER — MIDAZOLAM HCL 2 MG/2ML IJ SOLN
INTRAMUSCULAR | Status: AC | PRN
  Administered 2015-10-27: 1 mg via INTRAVENOUS
  Administered 2015-10-27: 0.5 mg via INTRAVENOUS
  Administered 2015-10-27: 1 mg via INTRAVENOUS

## 2015-10-27 MED ORDER — ONDANSETRON HCL 4 MG/2ML IJ SOLN
4.0000 mg | Freq: Once | INTRAMUSCULAR | Status: DC | PRN
  Filled 2015-10-27: qty 2

## 2015-10-27 MED ORDER — MIDAZOLAM HCL 2 MG/2ML IJ SOLN
INTRAMUSCULAR | Status: AC
  Filled 2015-10-27: qty 4

## 2015-10-27 NOTE — Telephone Encounter (Signed)
Call from [facility] in [place] [facility] requesting zofran order for patient to use at home.  Reported to Maudie Mercury while there in room 17 for liver biopsy she has intermittent Nausea.  Vomited yesterday.  Call [phone] to speak with patient or daughter before 5:00 pm when she is due for discharge.  After 5:00, call patient at home.

## 2015-10-27 NOTE — Telephone Encounter (Signed)
Per Julien Nordmann Compazine ordered for N/V and sent to her pharmacy.Pt notified.

## 2015-10-27 NOTE — Discharge Instructions (Signed)
Liver Biopsy The liver is a large organ in the upper right-hand side of your abdomen. A liver biopsy is a procedure in which a tissue sample is taken from the liver and examined under a microscope. The procedure is done to confirm a suspected problem. There are three types of liver biopsies:  Percutaneous. In this type, an incision is made in your abdomen. The sample is removed through the incision with a needle.  Laparoscopic. In this type, several incisions are made in the abdomen. A tiny camera is passed through one of the incisions to help guide the health care provider. The sample is removed through the other incision or incisions.  Transjugular. In this type, an incision is made in the neck. A tube is passed through the incision to the liver. The sample is removed through the tube with a needle. LET [facility] CARE PROVIDER KNOW ABOUT:  Any allergies you have.  All medicines you are taking, including vitamins, herbs, eye drops, creams, and over-the-counter medicines.  Previous problems you or members of your family have had with the use of anesthetics.  Any blood disorders you have.  Previous surgeries you have had.  Medical conditions you have.  Possibility of pregnancy, if this applies. RISKS AND COMPLICATIONS Generally, this is a safe procedure. However, problems can occur and include:  Bleeding.  Infection.  Bruising.  Collapsed lung.  Leak of digestive juices (bile) from the liver or gallbladder.  Problems with heart rhythm.  Pain at the biopsy site or in the right shoulder.  Low blood pressure (hypotension).  Injury to nearby organs or tissues. BEFORE THE PROCEDURE  Your health care provider may do some blood or urine tests. These will help your health care provider learn how well your kidneys and liver are working and how well your blood clots.  Ask your health care provider if you will be able to go home the day of the procedure. Arrange for someone to  take you home and stay with you for at least 24 hours.  Do not eat or drink anything after midnight on the night before the procedure or as directed by your health care provider.  Ask your health care provider about:  Changing or stopping your regular medicines. This is especially important if you are taking diabetes medicines or blood thinners.  Taking medicines such as aspirin and ibuprofen. These medicines can thin your blood. Do not take these medicines before your procedure if your health care provider asks you not to. PROCEDURE Regardless of the type of biopsy that will be done, you will have an IV line placed. Through this line, you will receive fluids and medicine to relax you. If you will be having a laparoscopic biopsy, you may also receive medicine through this line to make you sleep during the procedure (general anesthetic). Percutaneous Liver Biopsy  You will positioned on your back, with your right hand over your head.  A health care provider will locate your liver by tapping and pressing on the right side of your abdomen or with the help of an ultrasound machine or CT scan.  An area at the bottom of your last right rib will be numbed.  An incision will be made in the numbed area.  The biopsy needle will be inserted into the incision.  Several samples of liver tissue will be taken with the biopsy needle. You will be asked to hold your breath as each sample is taken. Laparoscopic Liver Biopsy  You will be  positioned on your back.  Several small incisions will be made in your abdomen.  Your doctor will pass a tiny camera through one incision. The camera will allow the liver to be viewed on a TV monitor in the operating room.  Tools will be passed through the other incision or incisions. These tools will be used to remove samples of liver tissue. Transjugular Liver Biopsy  You will be positioned on your back on an X-ray table, with your head turned to your left.  An  area on your neck just over your jugular vein will be numbed.  An incision will be made in the numbed area.  A tiny tube will be inserted through the incision. It will be pushed through the jugular vein to a blood vessel in the liver called the hepatic vein.  Dye will be inserted through the tube, and X-rays will be taken. The dye will make the blood vessels in the liver light up on the X-rays.  The biopsy needle will be pushed through the tube until it reaches the liver.  Samples of liver tissue will be taken with the biopsy needle.  The needle and the tube will be removed. After the samples are obtained, the incision or incisions will be closed. AFTER THE PROCEDURE  You will be taken to a recovery area.  You may have to lie on your right side for 1-2 hours. This will prevent bleeding from the biopsy site.  Your progress will be watched. Your blood pressure, pulse, and the biopsy site will be checked often.  You may have some pain or feel sick. If this happens, tell your health care provider.  As you begin to feel better, you will be offered ice and beverages.  You may be allowed to go home when the medicines have worn off and you can walk, drink, eat, and use the bathroom.   This information is not intended to replace advice given to you by your health care provider. Make sure you discuss any questions you have with your health care provider.   Document Released: 12/29/2003 Document Revised: 10/29/2014 Document Reviewed: 12/04/2013 Elsevier Interactive Patient Education 2016 Elsevier Inc. Liver Biopsy, Care After Refer to this sheet in the next few weeks. These instructions provide you with information on caring for yourself after your procedure. Your health care provider may also give you more specific instructions. Your treatment has been planned according to current medical practices, but problems sometimes occur. Call your health care provider if you have any problems or  questions after your procedure. WHAT TO EXPECT AFTER THE PROCEDURE After your procedure, it is typical to have the following:  A small amount of discomfort in the area where the biopsy was done and in the right shoulder or shoulder blade.  A small amount of bruising around the area where the biopsy was done and on the skin over the liver.  Sleepiness and fatigue for the rest of the day. HOME CARE INSTRUCTIONS   Rest at home for 1-2 days or as directed by your health care provider.  Have a friend or family member stay with you for at least 24 hours.  Because of the medicines used during the procedure, you should not do the following things in the first 24 hours:  Drive.  Use machinery.  Be responsible for the care of other people.  Sign legal documents.  Take a bath or shower.  There are many different ways to close and cover an incision, including stitches,  skin glue, and adhesive strips. Follow your health care provider's instructions on:  Incision care.  Bandage (dressing) changes and removal.  Incision closure removal.  Do not drink alcohol in the first week.  Do not lift more than 5 pounds or play contact sports for 2 weeks after this test.  Take medicines only as directed by your health care provider. Do not take medicine containing aspirin or non-steroidal anti-inflammatory medicines such as ibuprofen for 1 week after this test.  It is your responsibility to get your test results. SEEK MEDICAL CARE IF:   You have increased bleeding from an incision that results in more than a small spot of blood.  You have redness, swelling, or increasing pain in any incisions.  You notice a discharge or a bad smell coming from any of your incisions.  You have a fever or chills. SEEK IMMEDIATE MEDICAL CARE IF:   You develop swelling, bloating, or pain in your abdomen.  You become dizzy or faint.  You develop a rash.  You are nauseous or vomit.  You have difficulty  breathing, feel short of breath, or feel faint.  You develop chest pain.  You have problems with your speech or vision.  You have trouble balancing or moving your arms or legs.   This information is not intended to replace advice given to you by your health care provider. Make sure you discuss any questions you have with your health care provider.   Document Released: 04/27/2005 Document Revised: 10/29/2014 Document Reviewed: 12/04/2013 Elsevier Interactive Patient Education 2016 Elsevier Inc. Moderate Conscious Sedation, Adult Sedation is the use of medicines to promote relaxation and relieve discomfort and anxiety. Moderate conscious sedation is a type of sedation. Under moderate conscious sedation you are less alert than normal but are still able to respond to instructions or stimulation. Moderate conscious sedation is used during short medical and dental procedures. It is milder than deep sedation or general anesthesia and allows you to return to your regular activities sooner. LET [facility] CARE PROVIDER KNOW ABOUT:   Any allergies you have.  All medicines you are taking, including vitamins, herbs, eye drops, creams, and over-the-counter medicines.  Use of steroids (by mouth or creams).  Previous problems you or members of your family have had with the use of anesthetics.  Any blood disorders you have.  Previous surgeries you have had.  Medical conditions you have.  Possibility of pregnancy, if this applies.  Use of cigarettes, alcohol, or illegal drugs. RISKS AND COMPLICATIONS Generally, this is a safe procedure. However, as with any procedure, problems can occur. Possible problems include:  Oversedation.  Trouble breathing on your own. You may need to have a breathing tube until you are awake and breathing on your own.  Allergic reaction to any of the medicines used for the procedure. BEFORE THE PROCEDURE  You may have blood tests done. These tests can help show  how well your kidneys and liver are working. They can also show how well your blood clots.  A physical exam will be done.  Only take medicines as directed by your health care provider. You may need to stop taking medicines (such as blood thinners, aspirin, or nonsteroidal anti-inflammatory drugs) before the procedure.   Do not eat or drink at least 6 hours before the procedure or as directed by your health care provider.  Arrange for a responsible adult, family member, or friend to take you home after the procedure. He or she should stay with  you for at least 24 hours after the procedure, until the medicine has worn off. PROCEDURE   An intravenous (IV) catheter will be inserted into one of your veins. Medicine will be able to flow directly into your body through this catheter. You may be given medicine through this tube to help prevent pain and help you relax.  The medical or dental procedure will be done. AFTER THE PROCEDURE  You will stay in a recovery area until the medicine has worn off. Your blood pressure and pulse will be checked.   Depending on the procedure you had, you may be allowed to go home when you can tolerate liquids and your pain is under control.   This information is not intended to replace advice given to you by your health care provider. Make sure you discuss any questions you have with your health care provider.   Document Released: 07/03/2001 Document Revised: 10/29/2014 Document Reviewed: 06/15/2013 Elsevier Interactive Patient Education 2016 Elsevier Inc. Moderate Conscious Sedation, Adult, Care After Refer to this sheet in the next few weeks. These instructions provide you with information on caring for yourself after your procedure. Your health care provider may also give you more specific instructions. Your treatment has been planned according to current medical practices, but problems sometimes occur. Call your health care provider if you have any problems  or questions after your procedure. WHAT TO EXPECT AFTER THE PROCEDURE  After your procedure:  You may feel sleepy, clumsy, and have poor balance for several hours.  Vomiting may occur if you eat too soon after the procedure. HOME CARE INSTRUCTIONS  Do not participate in any activities where you could become injured for at least 24 hours. Do not:  Drive.  Swim.  Ride a bicycle.  Operate heavy machinery.  Cook.  Use power tools.  Climb ladders.  Work from a high place.  Do not make important decisions or sign legal documents until you are improved.  If you vomit, drink water, juice, or soup when you can drink without vomiting. Make sure you have little or no nausea before eating solid foods.  Only take over-the-counter or prescription medicines for pain, discomfort, or fever as directed by your health care provider.  Make sure you and your family fully understand everything about the medicines given to you, including what side effects may occur.  You should not drink alcohol, take sleeping pills, or take medicines that cause drowsiness for at least 24 hours.  If you smoke, do not smoke without supervision.  If you are feeling better, you may resume normal activities 24 hours after you were sedated.  Keep all appointments with your health care provider. SEEK MEDICAL CARE IF:  Your skin is pale or bluish in color.  You continue to feel nauseous or vomit.  Your pain is getting worse and is not helped by medicine.  You have bleeding or swelling.  You are still sleepy or feeling clumsy after 24 hours. SEEK IMMEDIATE MEDICAL CARE IF:  You develop a rash.  You have difficulty breathing.  You develop any type of allergic problem.  You have a fever. MAKE SURE YOU:  Understand these instructions.  Will watch your condition.  Will get help right away if you are not doing well or get worse.   This information is not intended to replace advice given to you by  your health care provider. Make sure you discuss any questions you have with your health care provider.   Document Released: 07/29/2013  Document Revised: 10/29/2014 Document Reviewed: 07/29/2013 Elsevier Interactive Patient Education [employer].

## 2015-10-27 NOTE — Procedures (Signed)
Interventional Radiology Procedure Note  Procedure: US guided core biopsy of left hepatic lesion.   Complications: None  Estimated Blood Loss: 0   Recommendations: - DC in 3 hrs after bedrest  Signed,  Criselda Peaches, MD

## 2015-10-27 NOTE — H&P (Signed)
Chief Complaint: Patient was seen in consultation today for ultrasound-guided liver lesion biopsy  Referring Physician(s): Mohamed,Mohamed  History of Present Illness: Katherine Pham is a 75 y.o. female , prior smoker, with recent imaging revealing bilateral lung masses , mediastinal/hilar adenopathy, liver lesions and right adrenal nodule. She presents today for ultrasound guided liver lesion biopsy for further evaluation.  Past Medical History  Diagnosis Date  . Hypertension   . Diabetes mellitus   . Diverticulosis     on colon-07/31/06  . Liver metastases ([place]) 10/20/2015    Past Surgical History  Procedure Laterality Date  . Abdominal hysterectomy    . Ectopic pregnancy surgery  1965  . Cataract extraction      lt eye 1996,rt eye 1996  . Breast cyst removal  1970    bilateral benign  . Colonoscopy    . Polypectomy      Allergies: Naproxen; Sulfa antibiotics; Aspirin; and Celebrex  Medications: Prior to Admission medications   Medication Sig Start Date End Date Taking? Authorizing Provider  ergocalciferol (VITAMIN D2) 50000 UNITS capsule  07/19/11  Yes Historical Provider, MD  hydrochlorothiazide (HYDRODIURIL) 25 MG tablet Take 25 mg by mouth Daily. 07/19/11  Yes Historical Provider, MD  terbinafine (LAMISIL) 1 % cream Apply 1 application topically 2 (two) times daily.   Yes Historical Provider, MD  amLODipine (NORVASC) 2.5 MG tablet Take 2.5 mg by mouth daily. 08/20/15   Historical Provider, MD  Artificial Tear Ointment (ARTIFICIAL TEARS) ointment as needed.      Historical Provider, MD  BD INSULIN SYRINGE ULTRAFINE 31G X 15/64" 0.5 ML MISC  06/20/11   Historical Provider, MD  cetirizine (ZYRTEC) 10 MG tablet Take 10 mg by mouth as needed.      Historical Provider, MD  estrogens, conjugated, (PREMARIN) 0.625 MG tablet Take 0.625 mg by mouth daily. Take daily for 21 days then do not take for 7 days.     Historical Provider, MD  insulin NPH (HUMULIN N,NOVOLIN N) 100  UNIT/ML injection Inject 100 Units into the skin 2 (two) times daily before a meal. Pt reports 50units in am, 35units in PM    Historical Provider, MD  lisinopril (PRINIVIL,ZESTRIL) 40 MG tablet Take 40 mg by mouth daily.      Historical Provider, MD  meloxicam (MOBIC) 7.5 MG tablet 1 tablet Once a day Orally 90 days 08/22/15   Historical Provider, MD  ONE TOUCH ULTRA TEST test strip  08/16/11   Historical Provider, MD  simvastatin (ZOCOR) 10 MG tablet Take 10 mg by mouth at bedtime.      Historical Provider, MD  triamcinolone cream (KENALOG) 0.1 % Apply topically 2 (two) times daily.      Historical Provider, MD     Family History  Problem Relation Age of Onset  . Colon cancer Mother   . Diabetes Sister   . Diabetes Brother   . Diabetes Maternal Grandmother   . Diabetes Sister     Social History   Social History  . Marital Status: Married    Spouse Name: N/A  . Number of Children: N/A  . Years of Education: N/A   Social History Main Topics  . Smoking status: Former Smoker    Types: Cigarettes    Quit date: 10/05/2015  . Smokeless tobacco: Never Used  . Alcohol Use: No  . Drug Use: No  . Sexual Activity: Not Asked   Other Topics Concern  . None   Social History Narrative  Review of Systems  Constitutional: Negative for fever and chills.  Respiratory: Negative for cough.        Occasional dyspnea with exertion  Cardiovascular: Negative for chest pain.  Gastrointestinal: Positive for nausea and vomiting. Negative for abdominal pain and blood in stool.  Genitourinary: Negative for dysuria and hematuria.  Musculoskeletal: Negative for back pain.  Neurological:       Occasional headaches    Vital Signs: BP 125/72 mmHg  Pulse 106  Temp(Src) 98.4 F (36.9 C) (Oral)  Resp 16  SpO2 100%  Physical Exam  Constitutional: She is oriented to person, place, and time. She appears well-developed and well-nourished.  Cardiovascular: Regular rhythm.   Sl tachy    Pulmonary/Chest: Effort normal.  Few right basilar crackles, left clear  Abdominal: Soft. Bowel sounds are normal. There is no tenderness.  Musculoskeletal: Normal range of motion. She exhibits no edema.  Neurological: She is alert and oriented to person, place, and time.    Mallampati Score:     Imaging: Ct Chest W Contrast  10/06/2015  CLINICAL DATA:  Cavitary left lung mass on recent chest radiograph. Current smoker. EXAM: CT CHEST WITH CONTRAST TECHNIQUE: Multidetector CT imaging of the chest was performed during intravenous contrast administration. CONTRAST:  36m ISOVUE-300 IOPAMIDOL (ISOVUE-300) INJECTION 61% COMPARISON:  Chest radiograph from one day prior. 09/01/2009 chest CT. FINDINGS: Mediastinum/Nodes: Normal heart size. Trace pericardial fluid/thickening. Great vessels are normal in course and caliber. No central pulmonary emboli. Normal visualized thyroid. Normal esophagus. No axillary adenopathy. There are several mildly enlarged right paratracheal nodes, largest 1.3 cm (series 2/ image 22). There is a mildly enlarged 1.1 cm subcarinal node (2/25). There is a mildly enlarged 1.3 cm prevascular left anterior mediastinal node (2/17). There is bulky aortopulmonary window lymphadenopathy, largest 2.2 cm (2/21). There is a mildly enlarged 1.2 cm left lower lobe peribronchial node (2/28). There is a mildly enlarged 1.1 cm right hilar node (2/26). Lungs/Pleura: No pneumothorax. No pleural effusion. There is a pleural-based 5.2 x 3.3 cm lobulated left lower lobe lung mass with partial internal cavitation (series 5/ image 29). There is a pleural-based lobulated 3.1 x 1.8 cm anterior left upper lobe lung mass (5/21). There is a spiculated 1.6 x 1.5 cm anterior apical right upper lobe pulmonary nodule (5/11). There are 3 additional subcentimeter solid right upper lobe pulmonary nodules, largest 0.9 cm (5/18). There is moderate centrilobular and paraseptal emphysema. No acute consolidative  airspace disease. Upper abdomen: There are innumerable (> than 20) hypodense liver masses scattered throughout the visualized liver, each of which demonstrates central hypoenhancement and a thickened hyperenhancing rim, largest 2.1 cm in segment 8 of the right liver lobe (series 2/ image 46). Right adrenal 1.0 cm nodule (series 2/image 51), which appears new since 20/10. Mild irregularity of the left adrenal gland, without discrete left adrenal nodule. Musculoskeletal: No aggressive appearing focal osseous lesions. Mild degenerative changes in the thoracic spine. IMPRESSION: 1. Dominant cavitary 5.2 x 3.3 cm left lower lobe lung mass, pleural-based, most in keeping with a primary bronchogenic carcinoma. Thoracic surgical and oncology consultation are advised. PET-CT would be useful for further staging. 2. Pleural-based 3.1 x 1.8 cm anterior left upper lobe lung mass. Spiculated 1.6 cm apical right upper lobe pulmonary nodule. These could represent synchronous primary bronchogenic carcinomas versus pulmonary metastases. 3. Three additional subcentimeter right upper lobe pulmonary nodules, likely pulmonary metastases. 4. Right paratracheal, subcarinal and AP window mediastinal lymphadenopathy and bilateral hilar lymphadenopathy, likely metastatic. 5. Innumerable (> than  20) liver masses with peripheral hyperenhancement, in keeping with liver metastases. 6. Right adrenal nodule, likely metastatic. 7. Moderate emphysema. Electronically Signed   By: Ilona Sorrel M.D.   On: 10/06/2015 15:53    Labs:  CBC:  Recent Labs  10/20/15 1208 10/27/15 1145  WBC 8.1 9.4  HGB 15.1 14.5  HCT 45.2 44.3  PLT 215 189    COAGS:  Recent Labs  10/27/15 1145  INR 1.06  APTT 29    BMP:  Recent Labs  10/20/15 1208  NA 138  K 4.3  CO2 22  GLUCOSE 200*  BUN 16.7  CALCIUM 10.3  CREATININE 0.9    LIVER FUNCTION TESTS:  Recent Labs  10/20/15 1208  BILITOT 0.47  AST 37*  ALT 24  ALKPHOS 95  PROT 7.2   ALBUMIN 3.6    TUMOR MARKERS: No results for input(s): AFPTM, CEA, CA199, CHROMGRNA in the last 8760 hours.  Assessment and Plan: 75 y.o. female , prior smoker, with recent imaging revealing bilateral lung masses , mediastinal/hilar adenopathy, liver lesions and right adrenal nodule. She presents today for ultrasound guided liver lesion biopsy for further evaluation.Risks and benefits discussed with the patient/daughter including, but not limited to bleeding, infection, damage to adjacent structures or low yield requiring additional tests.All of the patient's questions were answered, patient is agreeable to proceed.Consent signed and in chart.      Thank you for this interesting consult.  I greatly enjoyed meeting Katherine Pham and look forward to participating in their care.  A copy of this report was sent to the requesting provider on this date.  Signed: D. Rowe Robert 10/27/2015, 12:38 PM   I spent a total of 15 minutes in face to face in clinical consultation, greater than 50% of which was counseling/coordinating care for ultrasound-guided liver lesion biopsy

## 2015-11-01 ENCOUNTER — Other Ambulatory Visit (HOSPITAL_BASED_OUTPATIENT_CLINIC_OR_DEPARTMENT_OTHER): Payer: Medicare Other

## 2015-11-01 ENCOUNTER — Encounter: Payer: Self-pay | Admitting: *Deleted

## 2015-11-01 ENCOUNTER — Telehealth: Payer: Self-pay | Admitting: Internal Medicine

## 2015-11-01 ENCOUNTER — Encounter: Payer: Self-pay | Admitting: Internal Medicine

## 2015-11-01 ENCOUNTER — Ambulatory Visit: Payer: Medicare Other | Admitting: Nutrition

## 2015-11-01 ENCOUNTER — Ambulatory Visit (HOSPITAL_BASED_OUTPATIENT_CLINIC_OR_DEPARTMENT_OTHER): Payer: Medicare Other | Admitting: Internal Medicine

## 2015-11-01 VITALS — BP 118/56 | HR 115 | Temp 97.6°F | Resp 17 | Ht 64.0 in | Wt 130.2 lb

## 2015-11-01 DIAGNOSIS — R918 Other nonspecific abnormal finding of lung field: Secondary | ICD-10-CM

## 2015-11-01 DIAGNOSIS — C7970 Secondary malignant neoplasm of unspecified adrenal gland: Secondary | ICD-10-CM | POA: Diagnosis not present

## 2015-11-01 DIAGNOSIS — C801 Malignant (primary) neoplasm, unspecified: Secondary | ICD-10-CM

## 2015-11-01 DIAGNOSIS — C787 Secondary malignant neoplasm of liver and intrahepatic bile duct: Secondary | ICD-10-CM | POA: Diagnosis not present

## 2015-11-01 DIAGNOSIS — I10 Essential (primary) hypertension: Secondary | ICD-10-CM

## 2015-11-01 DIAGNOSIS — E785 Hyperlipidemia, unspecified: Secondary | ICD-10-CM

## 2015-11-01 DIAGNOSIS — E0800 Diabetes mellitus due to underlying condition with hyperosmolarity without nonketotic hyperglycemic-hyperosmolar coma (NKHHC): Secondary | ICD-10-CM

## 2015-11-01 DIAGNOSIS — J449 Chronic obstructive pulmonary disease, unspecified: Secondary | ICD-10-CM

## 2015-11-01 LAB — COMPREHENSIVE METABOLIC PANEL
ALT: 54 U/L (ref 0–55)
AST: 75 U/L — AB (ref 5–34)
Albumin: 3.5 g/dL (ref 3.5–5.0)
Alkaline Phosphatase: 146 U/L (ref 40–150)
Anion Gap: 12 mEq/L — ABNORMAL HIGH (ref 3–11)
BUN: 22.3 mg/dL (ref 7.0–26.0)
CHLORIDE: 105 meq/L (ref 98–109)
CO2: 19 meq/L — AB (ref 22–29)
CREATININE: 1.1 mg/dL (ref 0.6–1.1)
Calcium: 10.9 mg/dL — ABNORMAL HIGH (ref 8.4–10.4)
EGFR: 58 mL/min/{1.73_m2} — ABNORMAL LOW (ref >90)
GLUCOSE: 160 mg/dL — AB (ref 70–140)
Potassium: 4.1 mEq/L (ref 3.5–5.1)
Sodium: 137 mEq/L (ref 136–145)
TOTAL PROTEIN: 7.2 g/dL (ref 6.4–8.3)
Total Bilirubin: 0.79 mg/dL (ref 0.20–1.20)

## 2015-11-01 LAB — CBC WITH DIFFERENTIAL/PLATELET
BASO%: 0.6 % (ref 0.0–2.0)
Basophils Absolute: 0.1 10*3/uL (ref 0.0–0.1)
EOS%: 1.2 % (ref 0.0–7.0)
Eosinophils Absolute: 0.1 10*3/uL (ref 0.0–0.5)
HEMATOCRIT: 47.9 % — AB (ref 34.8–46.6)
HGB: 15.7 g/dL (ref 11.6–15.9)
LYMPH#: 1.4 10*3/uL (ref 0.9–3.3)
LYMPH%: 15.2 % (ref 14.0–49.7)
MCH: 29.7 pg (ref 25.1–34.0)
MCHC: 32.8 g/dL (ref 31.5–36.0)
MCV: 90.5 fL (ref 79.5–101.0)
MONO#: 0.8 10*3/uL (ref 0.1–0.9)
MONO%: 9 % (ref 0.0–14.0)
NEUT%: 74 % (ref 38.4–76.8)
NEUTROS ABS: 6.8 10*3/uL — AB (ref 1.5–6.5)
Platelets: 194 10*3/uL (ref 145–400)
RBC: 5.29 10*6/uL (ref 3.70–5.45)
RDW: 15.5 % — ABNORMAL HIGH (ref 11.2–14.5)
WBC: 9.2 10*3/uL (ref 3.9–10.3)

## 2015-11-01 NOTE — Progress Notes (Signed)
75 year old female diagnosed with lung cancer with liver metastases.  She is a patient of Dr. Julien Nordmann.  Past medical history includes hypertension, diabetes, diverticulosis, and tobacco.  Medications include insulin and Zocor.  Labs include glucose 200 on December 29.  Height: 64 inches. Weight: 130.2 pounds. Usual body weight: 136-137 pounds per patient. BMI: 22.34.  Patient reports mild nausea typically, on a daily basis.  She states nausea medication is helpful. Reports her appetite comes and goes; she forces herself to eat. She usually consumes 3 meals daily with an occasional bedtime snack. Patient appears to follow a no concentrated sweets diet. Denies other nutrition impact symptoms.  Nutrition diagnosis: Food and nutrition related knowledge deficit related to new diagnosis of lung cancer as evidenced by no prior need for nutrition related information.  Intervention: I educated patient to consume small frequent meals and snacks with high protein foods 6 times daily. Recommended patient try oral nutrition supplements such as Glucerna or boost glucose control. Reviewed high protein foods with patient and provided a fact sheet on increasing calories and protein. Also provided fact sheets on poor appetite and nausea vomiting. Questions were answered.  Teach back method used.  Contact information was provided.  Monitoring, evaluation, goals: Patient will tolerate adequate calories and protein to minimize weight loss.  Next visit: To be scheduled with treatment.  Patient has my contact information for further questions.  **Disclaimer: This note was dictated with voice recognition software. Similar sounding words can inadvertently be transcribed and this note may contain transcription errors which may not have been corrected upon publication of note.**

## 2015-11-01 NOTE — Telephone Encounter (Signed)
Talked to patient here in office. Scheduled appt.       [facility]. °

## 2015-11-01 NOTE — Progress Notes (Signed)
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  PRIOR THERAPY: none  CURRENT THERAPY: none  INTERVAL HISTORY: Katherine Pham 75 y.o. female returns to the clinic today for follow-up visit accompanied by her daughter. The patient is feeling fine today except for mild fatigue and shortness of breath with exertion. She also has occasional left-sided chest pain. She denied having any significant weight loss or night sweats. She has few episodes of nausea and vomiting recently. The patient had ultrasound guided core biopsy of one of the liver lesion and the final pathology (Accession: [id number]) showed poorly differentiated non-small cell carcinoma. The core biopsies are extensively involved by poorly differentiated carcinoma. Immunohistochemistry shows strong positivity with cytokeratin 7, nuclear positivity with CDX-2 and weak, patchy nuclear positivity with estrogen receptor. The tumor is negative with cytokeratin 20, cytokeratin 5/6, gross cystic disease fluid protein, progesterone receptor, Napsin-A, thyroid transcription factor-1 and mucicarmine. The immunophenotype is nonspecific and possibilities include an upper gastrointestinal primary. The patient is also scheduled for a PET scan and MRI of the brain on 11/04/2015. She is here today for evaluation and discussion of her biopsy results and further recommendation regarding her condition.  MEDICAL HISTORY: Past Medical History  Diagnosis Date  . Hypertension   . Diabetes mellitus   . Diverticulosis     on colon-07/31/06  . Liver metastases ([place])  10/20/2015    ALLERGIES:  is allergic to naproxen; sulfa antibiotics; aspirin; and celebrex.  MEDICATIONS:  Current Outpatient Prescriptions  Medication Sig Dispense Refill  . amLODipine (NORVASC) 2.5 MG tablet Take 2.5 mg by mouth daily.  0  . Artificial Tear Ointment (ARTIFICIAL TEARS) ointment as needed.      . BD INSULIN SYRINGE ULTRAFINE 31G X 15/64" 0.5 ML MISC     . cetirizine (ZYRTEC) 10 MG tablet Take 10 mg by mouth as needed.      . ergocalciferol (VITAMIN D2) 50000 UNITS capsule     . estrogens, conjugated, (PREMARIN) 0.625 MG tablet Take 0.625 mg by mouth daily. Take daily for 21 days then do not take for 7 days.     . hydrochlorothiazide (HYDRODIURIL) 25 MG tablet Take 25 mg by mouth Daily.    . insulin NPH (HUMULIN N,NOVOLIN N) 100 UNIT/ML injection Inject 100 Units into the skin 2 (two) times daily before a meal. Pt reports 50units in am, 35units in PM    . lisinopril (PRINIVIL,ZESTRIL) 40 MG tablet Take 40 mg by mouth daily.      . meloxicam (MOBIC) 7.5 MG tablet 1 tablet Once a day Orally 90 days  0  . ONE TOUCH ULTRA TEST test strip     . prochlorperazine (COMPAZINE) 10 MG tablet Take 1 tablet (10 mg total) by mouth every 6 (six) hours as needed for nausea or vomiting. 30 tablet 0  . simvastatin (ZOCOR) 10 MG tablet Take 10 mg by mouth at bedtime.      . terbinafine (LAMISIL) 1 % cream Apply 1 application topically 2 (two) times daily.    Marland Kitchen triamcinolone cream (KENALOG) 0.1 % Apply topically 2 (two) times daily.  No current facility-administered medications for this visit.    SURGICAL HISTORY:  Past Surgical History  Procedure Laterality Date  . Abdominal hysterectomy    . Ectopic pregnancy surgery  1965  . Cataract extraction      lt eye 1996,rt eye 1996  . Breast cyst removal  1970    bilateral benign  . Colonoscopy    . Polypectomy      REVIEW OF SYSTEMS:  Constitutional: positive for fatigue Eyes: negative Ears, nose, mouth, throat, and face:  negative Respiratory: positive for dyspnea on exertion and pleurisy/chest pain Cardiovascular: negative Gastrointestinal: positive for nausea and vomiting Genitourinary:negative Integument/breast: negative Hematologic/lymphatic: negative Musculoskeletal:negative Neurological: negative Behavioral/Psych: negative Endocrine: negative Allergic/Immunologic: negative   PHYSICAL EXAMINATION: General appearance: alert, cooperative, fatigued and no distress Head: Normocephalic, without obvious abnormality, atraumatic Neck: no adenopathy, no JVD, supple, symmetrical, trachea midline and thyroid not enlarged, symmetric, no tenderness/mass/nodules Lymph nodes: Cervical, supraclavicular, and axillary nodes normal. Resp: wheezes bilaterally Back: symmetric, no curvature. ROM normal. No CVA tenderness. Cardio: regular rate and rhythm, S1, S2 normal, no murmur, click, rub or gallop GI: soft, non-tender; bowel sounds normal; no masses,  no organomegaly Extremities: extremities normal, atraumatic, no cyanosis or edema Neurologic: Alert and oriented X 3, normal strength and tone. Normal symmetric reflexes. Normal coordination and gait  ECOG PERFORMANCE STATUS: 1 - Symptomatic but completely ambulatory  Blood pressure 118/56, pulse 115, temperature 97.6 F (36.4 C), temperature source Oral, resp. rate 17, height '5\' 4"'$  (1.626 m), weight 130 lb 3.2 oz (59.058 kg), SpO2 98 %.  LABORATORY DATA: Lab Results  Component Value Date   WBC 9.2 11/01/2015   HGB 15.7 11/01/2015   HCT 47.9* 11/01/2015   MCV 90.5 11/01/2015   PLT 194 11/01/2015      Chemistry      Component Value Date/Time   NA 136 10/27/2015 1145   NA 138 10/20/2015 1208   K 4.2 10/27/2015 1145   K 4.3 10/20/2015 1208   CL 104 10/27/2015 1145   CO2 18* 10/27/2015 1145   CO2 22 10/20/2015 1208   BUN 18 10/27/2015 1145   BUN 16.7 10/20/2015 1208   CREATININE 0.60 10/27/2015 1145   CREATININE 0.9 10/20/2015 1208      Component  Value Date/Time   CALCIUM 10.5* 10/27/2015 1145   CALCIUM 10.3 10/20/2015 1208   ALKPHOS 113 10/27/2015 1145   ALKPHOS 95 10/20/2015 1208   AST 64* 10/27/2015 1145   AST 37* 10/20/2015 1208   ALT 50 10/27/2015 1145   ALT 24 10/20/2015 1208   BILITOT 0.8 10/27/2015 1145   BILITOT 0.47 10/20/2015 1208       RADIOGRAPHIC STUDIES: Ct Chest W Contrast  10/06/2015  CLINICAL DATA:  Cavitary left lung mass on recent chest radiograph. Current smoker. EXAM: CT CHEST WITH CONTRAST TECHNIQUE: Multidetector CT imaging of the chest was performed during intravenous contrast administration. CONTRAST:  69m ISOVUE-300 IOPAMIDOL (ISOVUE-300) INJECTION 61% COMPARISON:  Chest radiograph from one day prior. 09/01/2009 chest CT. FINDINGS: Mediastinum/Nodes: Normal heart size. Trace pericardial fluid/thickening. Great vessels are normal in course and caliber. No central pulmonary emboli. Normal visualized thyroid. Normal esophagus. No axillary adenopathy. There are several mildly enlarged right paratracheal nodes, largest 1.3 cm (series 2/ image 22). There is a mildly enlarged 1.1 cm subcarinal node (2/25). There is a mildly enlarged 1.3 cm prevascular left anterior mediastinal node (2/17). There is bulky aortopulmonary window lymphadenopathy, largest 2.2 cm (2/21). There is a mildly enlarged 1.2 cm left lower  lobe peribronchial node (2/28). There is a mildly enlarged 1.1 cm right hilar node (2/26). Lungs/Pleura: No pneumothorax. No pleural effusion. There is a pleural-based 5.2 x 3.3 cm lobulated left lower lobe lung mass with partial internal cavitation (series 5/ image 29). There is a pleural-based lobulated 3.1 x 1.8 cm anterior left upper lobe lung mass (5/21). There is a spiculated 1.6 x 1.5 cm anterior apical right upper lobe pulmonary nodule (5/11). There are 3 additional subcentimeter solid right upper lobe pulmonary nodules, largest 0.9 cm (5/18). There is moderate centrilobular and paraseptal emphysema. No  acute consolidative airspace disease. Upper abdomen: There are innumerable (> than 20) hypodense liver masses scattered throughout the visualized liver, each of which demonstrates central hypoenhancement and a thickened hyperenhancing rim, largest 2.1 cm in segment 8 of the right liver lobe (series 2/ image 46). Right adrenal 1.0 cm nodule (series 2/image 51), which appears new since 20/10. Mild irregularity of the left adrenal gland, without discrete left adrenal nodule. Musculoskeletal: No aggressive appearing focal osseous lesions. Mild degenerative changes in the thoracic spine. IMPRESSION: 1. Dominant cavitary 5.2 x 3.3 cm left lower lobe lung mass, pleural-based, most in keeping with a primary bronchogenic carcinoma. Thoracic surgical and oncology consultation are advised. PET-CT would be useful for further staging. 2. Pleural-based 3.1 x 1.8 cm anterior left upper lobe lung mass. Spiculated 1.6 cm apical right upper lobe pulmonary nodule. These could represent synchronous primary bronchogenic carcinomas versus pulmonary metastases. 3. Three additional subcentimeter right upper lobe pulmonary nodules, likely pulmonary metastases. 4. Right paratracheal, subcarinal and AP window mediastinal lymphadenopathy and bilateral hilar lymphadenopathy, likely metastatic. 5. Innumerable (> than 20) liver masses with peripheral hyperenhancement, in keeping with liver metastases. 6. Right adrenal nodule, likely metastatic. 7. Moderate emphysema. Electronically Signed   By: Ilona Sorrel M.D.   On: 10/06/2015 15:53   [place] Core Biopsy  10/27/2015  CLINICAL DATA:  75 year old female with centrally necrotic lung mass and multiple hepatic lesions concerning for primary lung carcinoma with liver Mets. EXAM: ULTRASOUND CORE BIOPSY Date: 10/27/2015 ULTRASOUND GUIDED NEEDLE BIOPSY OF THE LIVER PROCEDURE: 1. Ultrasound-guided core biopsy metastatic liver lesion Interventional Radiologist:  Criselda Peaches, MD ANESTHESIA/SEDATION:  Moderate (conscious) sedation was used. 2.5 mg Versed, 50 mcg Fentanyl were administered intravenously. The patient's vital signs were monitored continuously by radiology nursing throughout the procedure. Sedation Time: 12 minutes MEDICATIONS: None additional TECHNIQUE: Informed consent was obtained from the patient following explanation of the procedure, risks, benefits and alternatives. The patient understands, agrees and consents for the procedure. All questions were addressed. A time out was performed. The right upper quadrant was interrogated with ultrasound. A suitable lesion in the left liver was identified. A suitable skin entry site was selected and marked. The region was then sterilely prepped and draped in standard fashion using chlorhexidine skin prep. Local anesthesia was attained by infiltration with 1% lidocaine. A small dermatotomy was made. Under real-time sonographic guidance, a 17 gauge trocar needle was advanced into the liver and positioned at the margin of the lesion. Multiple 18 gauge core biopsies were then coaxially obtained. Needle placement was confirmed on all biopsy passes with real-time sonography. Biopsy specimens were placed in formalin and delivered to pathology for further analysis. As the introducer needle was removed, the biopsy tract was embolized with a Gel-Foam slurry. Post biopsy ultrasound imaging demonstrates no active bleeding or perihepatic hematoma. The patient tolerated the procedure well. COMPLICATIONS: None. Estimated blood loss: 0 IMPRESSION: Technically successful ultrasound-guided core biopsy of  presumed metastatic liver lesion. Signed, Criselda Peaches, MD [facility] Electronically Signed   By: Jacqulynn Cadet M.D.   On: 10/27/2015 15:53    ASSESSMENT AND PLAN: This is a very pleasant 75 years old African-American female with stage IV non-small cell cancer highly suspicious for lung primary but  upper gastrointestinal malignancy could not be ruled out based on the recent pathology. I had a lengthy discussion with the patient and her daughter today about her condition. I recommended for her to have the PET scan and MRI of the brain performed on Friday as a scheduled. I will refer the patient to gastroenterology for evaluation and consideration of upper endoscopy to rule out any upper gastrointestinal primary. I would see the patient back for follow-up visit in one week for reevaluation and more detailed discussion of her treatment options based on the final staging workup and gastrointestinal evaluation. The patient was advised to call immediately if she has any concerning symptoms in the interval. The patient voices understanding of current disease status and treatment options and is in agreement with the current care plan.  All questions were answered. The patient knows to call the clinic with any problems, questions or concerns. We can certainly see the patient much sooner if necessary.  I spent 15 minutes counseling the patient face to face. The total time spent in the appointment was 25 minutes.  Disclaimer: This note was dictated with voice recognition software. Similar sounding words can inadvertently be transcribed and may not be corrected upon review.

## 2015-11-01 NOTE — Progress Notes (Signed)
Oncology Nurse Navigator Documentation  Oncology Nurse Navigator Flowsheets 11/01/2015  Navigator Location [facility]  Navigator Encounter Type Clinic/MDC/spoke with patient today at [facility].  She is doing well but has some anxiety in waiting for more results.  I listened as she explained. I states she understands she has to wait for all the information to come back. Daughter is with patient today and is supportive  Patient Visit Type MedOnc;Follow-up  Treatment Phase Abnormal Scans  Interventions Other  Time Spent with Patient 15

## 2015-11-04 ENCOUNTER — Ambulatory Visit (HOSPITAL_COMMUNITY)
Admission: RE | Admit: 2015-11-04 | Discharge: 2015-11-04 | Disposition: A | Discharge status: Home / Self Care | Payer: Medicare Other | Source: Ambulatory Visit | Attending: Internal Medicine | Admitting: Internal Medicine

## 2015-11-04 ENCOUNTER — Encounter (HOSPITAL_COMMUNITY)
Admission: RE | Admit: 2015-11-04 | Discharge: 2015-11-04 | Disposition: A | Discharge status: Home / Self Care | Payer: Medicare Other | Source: Ambulatory Visit | Attending: Internal Medicine | Admitting: Internal Medicine

## 2015-11-04 DIAGNOSIS — J449 Chronic obstructive pulmonary disease, unspecified: Secondary | ICD-10-CM | POA: Diagnosis not present

## 2015-11-04 DIAGNOSIS — C771 Secondary and unspecified malignant neoplasm of intrathoracic lymph nodes: Secondary | ICD-10-CM | POA: Insufficient documentation

## 2015-11-04 DIAGNOSIS — C3412 Malignant neoplasm of upper lobe, left bronchus or lung: Secondary | ICD-10-CM | POA: Diagnosis not present

## 2015-11-04 DIAGNOSIS — E785 Hyperlipidemia, unspecified: Secondary | ICD-10-CM | POA: Diagnosis not present

## 2015-11-04 DIAGNOSIS — R918 Other nonspecific abnormal finding of lung field: Secondary | ICD-10-CM

## 2015-11-04 DIAGNOSIS — C3411 Malignant neoplasm of upper lobe, right bronchus or lung: Secondary | ICD-10-CM | POA: Insufficient documentation

## 2015-11-04 DIAGNOSIS — C787 Secondary malignant neoplasm of liver and intrahepatic bile duct: Secondary | ICD-10-CM

## 2015-11-04 DIAGNOSIS — I10 Essential (primary) hypertension: Secondary | ICD-10-CM | POA: Diagnosis not present

## 2015-11-04 DIAGNOSIS — E042 Nontoxic multinodular goiter: Secondary | ICD-10-CM | POA: Diagnosis not present

## 2015-11-04 DIAGNOSIS — C3432 Malignant neoplasm of lower lobe, left bronchus or lung: Secondary | ICD-10-CM | POA: Diagnosis not present

## 2015-11-04 DIAGNOSIS — C3490 Malignant neoplasm of unspecified part of unspecified bronchus or lung: Secondary | ICD-10-CM | POA: Diagnosis present

## 2015-11-04 DIAGNOSIS — Z0189 Encounter for other specified special examinations: Secondary | ICD-10-CM | POA: Insufficient documentation

## 2015-11-04 DIAGNOSIS — C7951 Secondary malignant neoplasm of bone: Secondary | ICD-10-CM | POA: Insufficient documentation

## 2015-11-04 DIAGNOSIS — E0800 Diabetes mellitus due to underlying condition with hyperosmolarity without nonketotic hyperglycemic-hyperosmolar coma (NKHHC): Secondary | ICD-10-CM | POA: Insufficient documentation

## 2015-11-04 LAB — GLUCOSE, CAPILLARY: Glucose-Capillary: 146 mg/dL — ABNORMAL HIGH (ref 65–99)

## 2015-11-04 MED ORDER — FLUDEOXYGLUCOSE F - 18 (FDG) INJECTION
6.3000 | Freq: Once | INTRAVENOUS | Status: AC | PRN
  Administered 2015-11-04: 6.3 via INTRAVENOUS

## 2015-11-04 MED ORDER — GADOBENATE DIMEGLUMINE 529 MG/ML IV SOLN
15.0000 mL | Freq: Once | INTRAVENOUS | Status: AC | PRN
  Administered 2015-11-04: 12 mL via INTRAVENOUS

## 2015-11-11 ENCOUNTER — Encounter (HOSPITAL_COMMUNITY): Payer: Self-pay | Admitting: Emergency Medicine

## 2015-11-11 ENCOUNTER — Emergency Department (HOSPITAL_COMMUNITY)
Admission: EM | Admit: 2015-11-11 | Discharge: 2015-11-11 | Disposition: A | Discharge status: Home / Self Care | Payer: Medicare Other | Attending: Emergency Medicine | Admitting: Emergency Medicine

## 2015-11-11 ENCOUNTER — Other Ambulatory Visit (HOSPITAL_BASED_OUTPATIENT_CLINIC_OR_DEPARTMENT_OTHER): Payer: Medicare Other

## 2015-11-11 ENCOUNTER — Encounter: Payer: Self-pay | Admitting: Internal Medicine

## 2015-11-11 ENCOUNTER — Ambulatory Visit (HOSPITAL_BASED_OUTPATIENT_CLINIC_OR_DEPARTMENT_OTHER): Payer: Medicare Other | Admitting: Internal Medicine

## 2015-11-11 VITALS — BP 89/48 | HR 123 | Temp 98.0°F | Resp 18 | Ht 64.0 in | Wt 127.1 lb

## 2015-11-11 DIAGNOSIS — C7970 Secondary malignant neoplasm of unspecified adrenal gland: Secondary | ICD-10-CM

## 2015-11-11 DIAGNOSIS — Z8719 Personal history of other diseases of the digestive system: Secondary | ICD-10-CM | POA: Diagnosis not present

## 2015-11-11 DIAGNOSIS — R531 Weakness: Secondary | ICD-10-CM | POA: Diagnosis present

## 2015-11-11 DIAGNOSIS — Z8505 Personal history of malignant neoplasm of liver: Secondary | ICD-10-CM | POA: Diagnosis not present

## 2015-11-11 DIAGNOSIS — R630 Anorexia: Secondary | ICD-10-CM | POA: Diagnosis not present

## 2015-11-11 DIAGNOSIS — C787 Secondary malignant neoplasm of liver and intrahepatic bile duct: Secondary | ICD-10-CM

## 2015-11-11 DIAGNOSIS — E119 Type 2 diabetes mellitus without complications: Secondary | ICD-10-CM | POA: Diagnosis not present

## 2015-11-11 DIAGNOSIS — C7802 Secondary malignant neoplasm of left lung: Secondary | ICD-10-CM | POA: Diagnosis not present

## 2015-11-11 DIAGNOSIS — R627 Adult failure to thrive: Secondary | ICD-10-CM

## 2015-11-11 DIAGNOSIS — Z66 Do not resuscitate: Secondary | ICD-10-CM

## 2015-11-11 DIAGNOSIS — C7801 Secondary malignant neoplasm of right lung: Secondary | ICD-10-CM | POA: Diagnosis not present

## 2015-11-11 DIAGNOSIS — Z794 Long term (current) use of insulin: Secondary | ICD-10-CM | POA: Insufficient documentation

## 2015-11-11 DIAGNOSIS — Z79899 Other long term (current) drug therapy: Secondary | ICD-10-CM | POA: Insufficient documentation

## 2015-11-11 DIAGNOSIS — C801 Malignant (primary) neoplasm, unspecified: Secondary | ICD-10-CM

## 2015-11-11 DIAGNOSIS — R918 Other nonspecific abnormal finding of lung field: Secondary | ICD-10-CM

## 2015-11-11 DIAGNOSIS — E860 Dehydration: Secondary | ICD-10-CM | POA: Diagnosis not present

## 2015-11-11 DIAGNOSIS — D849 Immunodeficiency, unspecified: Secondary | ICD-10-CM | POA: Diagnosis not present

## 2015-11-11 DIAGNOSIS — Z7952 Long term (current) use of systemic steroids: Secondary | ICD-10-CM | POA: Insufficient documentation

## 2015-11-11 DIAGNOSIS — I10 Essential (primary) hypertension: Secondary | ICD-10-CM | POA: Insufficient documentation

## 2015-11-11 DIAGNOSIS — Z87891 Personal history of nicotine dependence: Secondary | ICD-10-CM | POA: Insufficient documentation

## 2015-11-11 DIAGNOSIS — K7689 Other specified diseases of liver: Secondary | ICD-10-CM

## 2015-11-11 HISTORY — DX: Other specified diseases of liver: K76.89

## 2015-11-11 HISTORY — DX: Do not resuscitate: Z66

## 2015-11-11 LAB — CBC WITH DIFFERENTIAL/PLATELET
BASO%: 0.7 % (ref 0.0–2.0)
Basophils Absolute: 0.1 10*3/uL (ref 0.0–0.1)
EOS%: 0.9 % (ref 0.0–7.0)
Eosinophils Absolute: 0.1 10*3/uL (ref 0.0–0.5)
HCT: 47.8 % — ABNORMAL HIGH (ref 34.8–46.6)
HEMOGLOBIN: 16 g/dL — AB (ref 11.6–15.9)
LYMPH%: 10.1 % — AB (ref 14.0–49.7)
MCH: 30.2 pg (ref 25.1–34.0)
MCHC: 33.5 g/dL (ref 31.5–36.0)
MCV: 90.1 fL (ref 79.5–101.0)
MONO#: 0.9 10*3/uL (ref 0.1–0.9)
MONO%: 8.8 % (ref 0.0–14.0)
NEUT#: 8.5 10*3/uL — ABNORMAL HIGH (ref 1.5–6.5)
NEUT%: 79.5 % — ABNORMAL HIGH (ref 38.4–76.8)
Platelets: 222 10*3/uL (ref 145–400)
RBC: 5.31 10*6/uL (ref 3.70–5.45)
RDW: 15.9 % — ABNORMAL HIGH (ref 11.2–14.5)
WBC: 10.7 10*3/uL — AB (ref 3.9–10.3)
lymph#: 1.1 10*3/uL (ref 0.9–3.3)

## 2015-11-11 LAB — COMPREHENSIVE METABOLIC PANEL
ALBUMIN: 3.3 g/dL — AB (ref 3.5–5.0)
ALT: 311 U/L (ref 0–55)
AST: 296 U/L — AB (ref 5–34)
Alkaline Phosphatase: 408 U/L — ABNORMAL HIGH (ref 40–150)
Anion Gap: 10 mEq/L (ref 3–11)
BUN: 16.5 mg/dL (ref 7.0–26.0)
CHLORIDE: 103 meq/L (ref 98–109)
CO2: 21 meq/L — AB (ref 22–29)
Calcium: 12.4 mg/dL — ABNORMAL HIGH (ref 8.4–10.4)
Creatinine: 0.7 mg/dL (ref 0.6–1.1)
GLUCOSE: 112 mg/dL (ref 70–140)
POTASSIUM: 4.9 meq/L (ref 3.5–5.1)
SODIUM: 133 meq/L — AB (ref 136–145)
Total Bilirubin: 1.11 mg/dL (ref 0.20–1.20)
Total Protein: 7 g/dL (ref 6.4–8.3)

## 2015-11-11 LAB — CBG MONITORING, ED: Glucose-Capillary: 128 mg/dL — ABNORMAL HIGH (ref 65–99)

## 2015-11-11 MED ORDER — METHYLPREDNISOLONE 4 MG PO TBPK: ORAL_TABLET | ORAL | Status: AC

## 2015-11-11 MED ORDER — SODIUM CHLORIDE 0.9 % IV BOLUS (SEPSIS)
2000.0000 mL | Freq: Once | INTRAVENOUS | Status: AC
  Administered 2015-11-11: 2000 mL via INTRAVENOUS

## 2015-11-11 NOTE — Progress Notes (Signed)
[place] Telephone:([phone]   [phone]  OFFICE PROGRESS NOTE  Roger Shelter, MD No address on file  DIAGNOSIS: Stage IV (T2b, N3, M1b) poorly Differentiated non-small cell carcinoma diagnosed in January 2000 15 and presenting with cavitary left lower lobe lung mass in addition to pleural-based left upper lobe mass and right upper lobe pulmonary nodules in addition to  Mediastinal , adrenal and liver metastasis.  PRIOR THERAPY: None  CURRENT THERAPY: None  INTERVAL HISTORY: Katherine Pham 75 y.o. female returns to the clinic today for follow-up visit accompanied by her daughter. The patient came to the clinic today in very poor condition with significant fatigue and weakness. She was unable to eat and drink for the last few days. She has nausea, lack of appetite and weight loss. She is dehydrated with low blood pressure of 89/48 and pulse 124. She denied having any significant chest pain but continues to have shortness of breath and mild cough with no hemoptysis. She had few studies performed recently including a PET scan and MRI of the brain. The PET scan was positive for hypermetabolic mass in the left lower lobe with additional hypermetabolic lesions in the left upper lobe and right upper lobe as well as bilateral mediastinal lymph node metastasis, left supraclavicular metastasis and extensive replacement of the liver with metastatic disease in addition to bone metastasis. The patient was supposed to see gastroenterology for consideration of upper endoscopy and evaluation of the primary malignancy and the final pathology was suspicious for upper gastrointestinal versus lung but she never had the appointment. She is here today with her daughter for evaluation and discussion of her treatment options.  MEDICAL HISTORY: Past Medical History  Diagnosis Date  . Hypertension   . Diabetes mellitus   . Diverticulosis     on colon-07/31/06  . Liver metastases  ([place]) 10/20/2015    ALLERGIES:  is allergic to naproxen; sulfa antibiotics; aspirin; and celebrex.  MEDICATIONS:  Current Outpatient Prescriptions  Medication Sig Dispense Refill  . amLODipine (NORVASC) 2.5 MG tablet Take 2.5 mg by mouth daily. Reported on 11/11/2015  0  . Artificial Tear Ointment (ARTIFICIAL TEARS) ointment as needed. Reported on 11/11/2015    . BD INSULIN SYRINGE ULTRAFINE 31G X 15/64" 0.5 ML MISC Reported on 11/11/2015    . cetirizine (ZYRTEC) 10 MG tablet Take 10 mg by mouth as needed. Reported on 11/11/2015    . ergocalciferol (VITAMIN D2) 50000 UNITS capsule Reported on 11/11/2015    . estrogens, conjugated, (PREMARIN) 0.625 MG tablet Take 0.625 mg by mouth daily. Reported on 11/11/2015    . hydrochlorothiazide (HYDRODIURIL) 25 MG tablet Take 25 mg by mouth Daily. Reported on 11/11/2015    . insulin NPH (HUMULIN N,NOVOLIN N) 100 UNIT/ML injection Inject 100 Units into the skin 2 (two) times daily before a meal. Reported on 11/11/2015    . lisinopril (PRINIVIL,ZESTRIL) 40 MG tablet Take 40 mg by mouth daily. Reported on 11/11/2015    . meloxicam (MOBIC) 7.5 MG tablet Reported on 11/11/2015  0  . ONE TOUCH ULTRA TEST test strip Reported on 11/11/2015    . prochlorperazine (COMPAZINE) 10 MG tablet Take 1 tablet (10 mg total) by mouth every 6 (six) hours as needed for nausea or vomiting. (Patient not taking: Reported on 11/11/2015) 30 tablet 0  . simvastatin (ZOCOR) 10 MG tablet Take 10 mg by mouth at bedtime. Reported on 11/11/2015    . terbinafine (LAMISIL) 1 % cream Apply  1 application topically 2 (two) times daily. Reported on 11/11/2015    . triamcinolone cream (KENALOG) 0.1 % Apply topically 2 (two) times daily. Reported on 11/11/2015     No current facility-administered medications for this visit.    SURGICAL HISTORY:  Past Surgical History  Procedure Laterality Date  . Abdominal hysterectomy    . Ectopic pregnancy surgery  1965  . Cataract extraction      lt eye 1996,rt  eye 1996  . Breast cyst removal  1970    bilateral benign  . Colonoscopy    . Polypectomy      REVIEW OF SYSTEMS:  Constitutional: positive for anorexia, fatigue and weight loss Eyes: negative Ears, nose, mouth, throat, and face: negative Respiratory: positive for cough and dyspnea on exertion Cardiovascular: negative Gastrointestinal: positive for abdominal pain, nausea and vomiting Genitourinary:negative Integument/breast: negative Hematologic/lymphatic: negative Musculoskeletal:negative Neurological: negative Behavioral/Psych: negative Endocrine: negative Allergic/Immunologic: negative   PHYSICAL EXAMINATION: General appearance: alert, cooperative, fatigued and no distress Head: Normocephalic, without obvious abnormality, atraumatic Neck: no adenopathy, no JVD, supple, symmetrical, trachea midline and thyroid not enlarged, symmetric, no tenderness/mass/nodules Lymph nodes: Cervical, supraclavicular, and axillary nodes normal. Resp: wheezes bilaterally Back: symmetric, no curvature. ROM normal. No CVA tenderness. Cardio: regular rate and rhythm, S1, S2 normal, no murmur, click, rub or gallop GI: soft, non-tender; bowel sounds normal; no masses,  no organomegaly Extremities: extremities normal, atraumatic, no cyanosis or edema Neurologic: Alert and oriented X 3, normal strength and tone. Normal symmetric reflexes. Normal coordination and gait  ECOG PERFORMANCE STATUS: 2 - Symptomatic, <50% confined to bed  Blood pressure 89/48, pulse 123, temperature 98 F (36.7 C), temperature source Oral, resp. rate 18, height '5\' 4"'$  (1.626 m), weight 127 lb 1.6 oz (57.652 kg), SpO2 100 %.  LABORATORY DATA: Lab Results  Component Value Date   WBC 10.7* 11/11/2015   HGB 16.0* 11/11/2015   HCT 47.8* 11/11/2015   MCV 90.1 11/11/2015   PLT 222 11/11/2015      Chemistry      Component Value Date/Time   NA 137 11/01/2015 1324   NA 136 10/27/2015 1145   K 4.1 11/01/2015 1324   K 4.2  10/27/2015 1145   CL 104 10/27/2015 1145   CO2 19* 11/01/2015 1324   CO2 18* 10/27/2015 1145   BUN 22.3 11/01/2015 1324   BUN 18 10/27/2015 1145   CREATININE 1.1 11/01/2015 1324   CREATININE 0.60 10/27/2015 1145      Component Value Date/Time   CALCIUM 10.9* 11/01/2015 1324   CALCIUM 10.5* 10/27/2015 1145   ALKPHOS 146 11/01/2015 1324   ALKPHOS 113 10/27/2015 1145   AST 75* 11/01/2015 1324   AST 64* 10/27/2015 1145   ALT 54 11/01/2015 1324   ALT 50 10/27/2015 1145   BILITOT 0.79 11/01/2015 1324   BILITOT 0.8 10/27/2015 1145       RADIOGRAPHIC STUDIES: Mr Jeri Cos Wo Contrast  2015-11-16  CLINICAL DATA:  New diagnosis lung cancer. Rule out metastatic disease. EXAM: MRI HEAD WITHOUT AND WITH CONTRAST TECHNIQUE: Multiplanar, multiecho pulse sequences of the brain and surrounding structures were obtained without and with intravenous contrast. CONTRAST:  69m MULTIHANCE GADOBENATE DIMEGLUMINE 529 MG/ML IV SOLN COMPARISON:  None. FINDINGS: Ventricle size normal.  Mild atrophy. Negative for acute infarction. Mild chronic microvascular ischemic change in the white matter. Brainstem and cerebellum intact. Basal ganglia normal. Negative for intracranial hemorrhage Negative for mass or edema.  No shift of the midline structures.  I Postcontrast imaging  reveals normal enhancement. No enhancing mass lesion. Normal vascular enhancement. Leptomeningeal enhancement normal. Paranasal sinuses show mild mucosal edema in the right maxillary sinus. Otherwise clear. Pituitary normal in size. Central disc protrusion C3-4. IMPRESSION: Negative for metastatic disease to the brain. No acute intracranial abnormality. Electronically Signed   By: Franchot Gallo M.D.   On: 11/04/2015 16:24   Nm Pet Image Initial (pi) Skull Base To Thigh  11/04/2015  CLINICAL DATA:  Initial Treatment strategy for lung cancer. EXAM: NUCLEAR MEDICINE PET SKULL BASE TO THIGH TECHNIQUE: 6.3 mCi F-18 FDG was injected intravenously.  Full-ring PET imaging was performed from the skull base to thigh after the radiotracer. CT data was obtained and used for attenuation correction and anatomic localization. FASTING BLOOD GLUCOSE:  Value: 146 mg/dl COMPARISON:  None. FINDINGS: NECK Hypermetabolic nodule within the left lobe of thyroid gland measures 9 mm and has an SUV max equal to 5.8. Adjacent hypermetabolic supraclavicular lymph node measures 7 mm, image 34 of series 4, and has an SUV max equal to 3.9. CHEST There is a mass within the left lower lobe which measures 5 cm. This has an SUV max equal to 16.8, image 36 of series 8. Anterior left upper lobe lung mass is hypermetabolic measuring 3.1 cm, image 21 of series 8. SUV max is equal to 16.3. There are several hypermetabolic nodules within the right lung. Index lesion in the right upper lobe measures 1.6 cm, image 17 of series 8. The SUV max is equal to 18.49. Hypermetabolic left hilar lymph node has an SUV max equal to 5.9, image number 62. Hypermetabolic bilateral mediastinal lymph nodes are identified. Index right paratracheal lymph node measures 11 mm and has an SUV max equal to 8.59 ABDOMEN/PELVIS Widespread liver metastases identified. Index lesion within the right lobe of liver measures 2.8 cm and has an SUV max equal to 12.13. Index lesion involving the lateral segment of left lobe of liver measures 2.5 cm and has an SUV max equal to 10.7. Index lesion within inferior right hepatic lobe measures 4.1 cm and has an SUV max equal to 15.4. No abnormal uptake within the pancreas or spleen. The adrenal glands are unremarkable. Bilateral renal cysts noted. SKELETON Multifocal hypermetabolic bone metastases identified. Index lesion involving the left iliac bone measures 11 mm and has an SUV max equal to 5.98. Index lesion involving the sacrum measures 10 mm and has an SUV max equal to 6.97. Hypermetabolic lesion involving the L3 vertebra has an SUV max equal to 8.3. IMPRESSION: 1. Examination is  positive for hypermetabolic mass involving the left lower lobe. There are additional hypermetabolic lesions within the left upper lobe and right upper lobe. Bilateral mediastinal lymph node metastasis and left supraclavicular metastasis are also present. There is also evidence of extensive liver and bone metastasis. 2. Hypermetabolic nodule in left lobe of thyroid gland. Hypermetabolic thyroid nodules on PET have up to 40-50% incidence of malignancy; recommend further evaluation with thyroid ultrasound and possible US-guided fine needle aspiration. Electronically Signed   By: Kerby Moors M.D.   On: 11/04/2015 15:22   [place] Core Biopsy  10/27/2015  CLINICAL DATA:  75 year old female with centrally necrotic lung mass and multiple hepatic lesions concerning for primary lung carcinoma with liver Mets. EXAM: ULTRASOUND CORE BIOPSY Date: 10/27/2015 ULTRASOUND GUIDED NEEDLE BIOPSY OF THE LIVER PROCEDURE: 1. Ultrasound-guided core biopsy metastatic liver lesion Interventional Radiologist:  Criselda Peaches, MD ANESTHESIA/SEDATION: Moderate (conscious) sedation was used. 2.5 mg Versed, 50 mcg Fentanyl were administered  intravenously. The patient's vital signs were monitored continuously by radiology nursing throughout the procedure. Sedation Time: 12 minutes MEDICATIONS: None additional TECHNIQUE: Informed consent was obtained from the patient following explanation of the procedure, risks, benefits and alternatives. The patient understands, agrees and consents for the procedure. All questions were addressed. A time out was performed. The right upper quadrant was interrogated with ultrasound. A suitable lesion in the left liver was identified. A suitable skin entry site was selected and marked. The region was then sterilely prepped and draped in standard fashion using chlorhexidine skin prep. Local anesthesia was attained by infiltration with 1% lidocaine. A small dermatotomy was made. Under real-time sonographic guidance,  a 17 gauge trocar needle was advanced into the liver and positioned at the margin of the lesion. Multiple 18 gauge core biopsies were then coaxially obtained. Needle placement was confirmed on all biopsy passes with real-time sonography. Biopsy specimens were placed in formalin and delivered to pathology for further analysis. As the introducer needle was removed, the biopsy tract was embolized with a Gel-Foam slurry. Post biopsy ultrasound imaging demonstrates no active bleeding or perihepatic hematoma. The patient tolerated the procedure well. COMPLICATIONS: None. Estimated blood loss: 0 IMPRESSION: Technically successful ultrasound-guided core biopsy of presumed metastatic liver lesion. Signed, Criselda Peaches, MD [facility] [address] [facility] Electronically Signed   By: Jacqulynn Cadet M.D.   On: 10/27/2015 15:53    ASSESSMENT AND PLAN: This is a very pleasant 75 years old African-American female with stage IV non-small cell cancer highly suspicious for lung primary but upper gastrointestinal malignancy could not be ruled out based on the recent pathology. The recent PET scan showed extensive disease with almost replacement of her liver with metastatic disease. MRI of the brain showed no evidence for metastatic disease to the brain. Her condition is declining rapidly. I had a lengthy discussion with the patient and her daughter about her current condition and treatment options. Unfortunately the patient has very poor prognosis and high risk to proceed with systemic therapy with the significant liver dysfunction. I strongly recommended for the patient to consider palliative care and hospice at this point. The patient and her daughter are in agreement with this plan. For the dehydration and failure to thrive, I will send the patient to the emergency department at [facility] for evaluation, hydration as well as consideration of admission for few days  until her condition improves before going home with hospice. CODE STATUS discussed with the patient and she is no CODE BLUE. The patient was advised to call immediately if she has any concerning symptoms in the interval. The patient voices understanding of current disease status and treatment options and is in agreement with the current care plan.  All questions were answered. The patient knows to call the clinic with any problems, questions or concerns. We can certainly see the patient much sooner if necessary.  I spent 20 minutes counseling the patient face to face. The total time spent in the appointment was 30 minutes.  Disclaimer: This note was dictated with voice recognition software. Similar sounding words can inadvertently be transcribed and may not be corrected upon review.

## 2015-11-11 NOTE — Progress Notes (Signed)
Pt weak hypotensive , slouching in chair , alert , denies pain now but "very uncomfortable"  rubbing her LUQ. Daughter with pt. She did not take any of her medications today and cannot dress herself due to weakness.

## 2015-11-11 NOTE — ED Notes (Signed)
Per [place] RN, patient was hypotensive 70/49-patient states she got up this am and felt dizzy-did not take BP meds-sent for eval

## 2015-11-11 NOTE — ED Provider Notes (Addendum)
CSN: [id number]     Arrival date & time 11/11/15  0920 History   None    Chief Complaint  Patient presents with  . hypotensive    chief complaint weakness   (Consider location/radiation/quality/duration/timing/severity/associated sxs/prior Treatment) HPI Patient seen at [facility] this morning by Dr.Mohammad. She complains of generalized weakness for the past month, and by diminished appetite. Weakness is exacerbated by standing and improved with lying down. Diminished appetite for one month. She has had left-sided abdominal pain for the past 2 months. No other associated symptoms no pain at present. No treatment prior to coming here. Denies blood per rectum. Denies fever denies cough or chest pain. No abdominal pain at present. No urinary symptoms Past Medical History  Diagnosis Date  . Hypertension   . Diabetes mellitus   . Diverticulosis     on colon-07/31/06  . Liver metastases ([place]) 10/20/2015  . Liver dysfunction 11/11/2015  . DNR no code (do not resuscitate) 11/11/2015   Past Surgical History  Procedure Laterality Date  . Abdominal hysterectomy    . Ectopic pregnancy surgery  1965  . Cataract extraction      lt eye 1996,rt eye 1996  . Breast cyst removal  1970    bilateral benign  . Colonoscopy    . Polypectomy     Family History  Problem Relation Age of Onset  . Colon cancer Mother   . Diabetes Sister   . Diabetes Brother   . Diabetes Maternal Grandmother   . Diabetes Sister    Social History  Substance Use Topics  . Smoking status: Former Smoker    Types: Cigarettes    Quit date: 10/05/2015  . Smokeless tobacco: Never Used  . Alcohol Use: No   OB History    No data available     Review of Systems  Constitutional: Positive for appetite change.  Allergic/Immunologic: Positive for immunocompromised state.  Neurological: Positive for weakness.  All other systems reviewed and are negative.     Allergies  Naproxen; Sulfa antibiotics; Aspirin; and  Celebrex  Home Medications   Prior to Admission medications   Medication Sig Start Date End Date Taking? Authorizing Provider  amLODipine (NORVASC) 2.5 MG tablet Take 2.5 mg by mouth daily. Reported on 11/11/2015 08/20/15   Historical Provider, MD  Artificial Tear Ointment (ARTIFICIAL TEARS) ointment as needed. Reported on 11/11/2015    Historical Provider, MD  BD INSULIN SYRINGE ULTRAFINE 31G X 15/64" 0.5 ML MISC Reported on 11/11/2015 06/20/11   Historical Provider, MD  cetirizine (ZYRTEC) 10 MG tablet Take 10 mg by mouth as needed. Reported on 11/11/2015    Historical Provider, MD  ergocalciferol (VITAMIN D2) 50000 UNITS capsule Reported on 11/11/2015 07/19/11   Historical Provider, MD  estrogens, conjugated, (PREMARIN) 0.625 MG tablet Take 0.625 mg by mouth daily. Reported on 11/11/2015    Historical Provider, MD  hydrochlorothiazide (HYDRODIURIL) 25 MG tablet Take 25 mg by mouth Daily. Reported on 11/11/2015 07/19/11   Historical Provider, MD  insulin NPH (HUMULIN N,NOVOLIN N) 100 UNIT/ML injection Inject 100 Units into the skin 2 (two) times daily before a meal. Reported on 11/11/2015    Historical Provider, MD  lisinopril (PRINIVIL,ZESTRIL) 40 MG tablet Take 40 mg by mouth daily. Reported on 11/11/2015    Historical Provider, MD  meloxicam (MOBIC) 7.5 MG tablet Reported on 11/11/2015 08/22/15   Historical Provider, MD  ONE TOUCH ULTRA TEST test strip Reported on 11/11/2015 08/16/11   Historical Provider, MD  prochlorperazine (COMPAZINE) 10  MG tablet Take 1 tablet (10 mg total) by mouth every 6 (six) hours as needed for nausea or vomiting. Patient not taking: Reported on 11/11/2015 10/27/15   Curt Bears, MD  simvastatin (ZOCOR) 10 MG tablet Take 10 mg by mouth at bedtime. Reported on 11/11/2015    Historical Provider, MD  terbinafine (LAMISIL) 1 % cream Apply 1 application topically 2 (two) times daily. Reported on 11/11/2015    Historical Provider, MD  triamcinolone cream (KENALOG) 0.1 % Apply  topically 2 (two) times daily. Reported on 11/11/2015    Historical Provider, MD   BP 115/64 mmHg  Pulse 100  Temp(Src) 97.4 F (36.3 C) (Oral)  Resp 18  SpO2 94% Physical Exam  Constitutional: She is oriented to person, place, and time. No distress.  Chronically ill-appearing  HENT:  Head: Normocephalic and atraumatic.  Right Ear: External ear normal.  Left Ear: External ear normal.  Mucous membranes dry  Eyes: Conjunctivae are normal. Pupils are equal, round, and reactive to light.  Neck: Neck supple. No tracheal deviation present. No thyromegaly present.  Cardiovascular: Normal rate and regular rhythm.   No murmur heard. Pulmonary/Chest: Effort normal and breath sounds normal.  Abdominal: Soft. Bowel sounds are normal. She exhibits no distension. There is no tenderness.  Musculoskeletal: Normal range of motion. She exhibits no edema or tenderness.  Neurological: She is alert and oriented to person, place, and time. No cranial nerve deficit. Coordination normal.  Skin: Skin is warm and dry. No rash noted.  Psychiatric: She has a normal mood and affect.  Nursing note and vitals reviewed.   ED Course  Procedures (including critical care time) Labs Review Labs Reviewed - No data to display  Imaging Review No results found. I have personally reviewed and evaluated these images and lab results as part of my medical decision-making.   EKG Interpretation None     At 10:30 AM patient is alert no distress she is requesting food. She feels better after treatment with intravenous hydration Results for orders placed or performed during the hospital encounter of 11/11/15  CBG monitoring, ED  Result Value Ref Range   Glucose-Capillary 128 (H) 65 - 99 mg/dL   Mr Jeri Cos Wo Contrast  11/04/2015  CLINICAL DATA:  New diagnosis lung cancer. Rule out metastatic disease. EXAM: MRI HEAD WITHOUT AND WITH CONTRAST TECHNIQUE: Multiplanar, multiecho pulse sequences of the brain and  surrounding structures were obtained without and with intravenous contrast. CONTRAST:  65m MULTIHANCE GADOBENATE DIMEGLUMINE 529 MG/ML IV SOLN COMPARISON:  None. FINDINGS: Ventricle size normal.  Mild atrophy. Negative for acute infarction. Mild chronic microvascular ischemic change in the white matter. Brainstem and cerebellum intact. Basal ganglia normal. Negative for intracranial hemorrhage Negative for mass or edema.  No shift of the midline structures.  I Postcontrast imaging reveals normal enhancement. No enhancing mass lesion. Normal vascular enhancement. Leptomeningeal enhancement normal. Paranasal sinuses show mild mucosal edema in the right maxillary sinus. Otherwise clear. Pituitary normal in size. Central disc protrusion C3-4. IMPRESSION: Negative for metastatic disease to the brain. No acute intracranial abnormality. Electronically Signed   By: CFranchot GalloM.D.   On: 11/04/2015 16:24   Nm Pet Image Initial (pi) Skull Base To Thigh  11/04/2015  CLINICAL DATA:  Initial Treatment strategy for lung cancer. EXAM: NUCLEAR MEDICINE PET SKULL BASE TO THIGH TECHNIQUE: 6.3 mCi F-18 FDG was injected intravenously. Full-ring PET imaging was performed from the skull base to thigh after the radiotracer. CT data was obtained and used  for attenuation correction and anatomic localization. FASTING BLOOD GLUCOSE:  Value: 146 mg/dl COMPARISON:  None. FINDINGS: NECK Hypermetabolic nodule within the left lobe of thyroid gland measures 9 mm and has an SUV max equal to 5.8. Adjacent hypermetabolic supraclavicular lymph node measures 7 mm, image 34 of series 4, and has an SUV max equal to 3.9. CHEST There is a mass within the left lower lobe which measures 5 cm. This has an SUV max equal to 16.8, image 36 of series 8. Anterior left upper lobe lung mass is hypermetabolic measuring 3.1 cm, image 21 of series 8. SUV max is equal to 16.3. There are several hypermetabolic nodules within the right lung. Index lesion in the  right upper lobe measures 1.6 cm, image 17 of series 8. The SUV max is equal to 18.49. Hypermetabolic left hilar lymph node has an SUV max equal to 5.9, image number 62. Hypermetabolic bilateral mediastinal lymph nodes are identified. Index right paratracheal lymph node measures 11 mm and has an SUV max equal to 8.59 ABDOMEN/PELVIS Widespread liver metastases identified. Index lesion within the right lobe of liver measures 2.8 cm and has an SUV max equal to 12.13. Index lesion involving the lateral segment of left lobe of liver measures 2.5 cm and has an SUV max equal to 10.7. Index lesion within inferior right hepatic lobe measures 4.1 cm and has an SUV max equal to 15.4. No abnormal uptake within the pancreas or spleen. The adrenal glands are unremarkable. Bilateral renal cysts noted. SKELETON Multifocal hypermetabolic bone metastases identified. Index lesion involving the left iliac bone measures 11 mm and has an SUV max equal to 5.98. Index lesion involving the sacrum measures 10 mm and has an SUV max equal to 6.97. Hypermetabolic lesion involving the L3 vertebra has an SUV max equal to 8.3. IMPRESSION: 1. Examination is positive for hypermetabolic mass involving the left lower lobe. There are additional hypermetabolic lesions within the left upper lobe and right upper lobe. Bilateral mediastinal lymph node metastasis and left supraclavicular metastasis are also present. There is also evidence of extensive liver and bone metastasis. 2. Hypermetabolic nodule in left lobe of thyroid gland. Hypermetabolic thyroid nodules on PET have up to 40-50% incidence of malignancy; recommend further evaluation with thyroid ultrasound and possible US-guided fine needle aspiration. Electronically Signed   By: Kerby Moors M.D.   On: 11/04/2015 15:22   [place] Core Biopsy  10/27/2015  CLINICAL DATA:  75 year old female with centrally necrotic lung mass and multiple hepatic lesions concerning for primary lung carcinoma with liver  Mets. EXAM: ULTRASOUND CORE BIOPSY Date: 10/27/2015 ULTRASOUND GUIDED NEEDLE BIOPSY OF THE LIVER PROCEDURE: 1. Ultrasound-guided core biopsy metastatic liver lesion Interventional Radiologist:  Criselda Peaches, MD ANESTHESIA/SEDATION: Moderate (conscious) sedation was used. 2.5 mg Versed, 50 mcg Fentanyl were administered intravenously. The patient's vital signs were monitored continuously by radiology nursing throughout the procedure. Sedation Time: 12 minutes MEDICATIONS: None additional TECHNIQUE: Informed consent was obtained from the patient following explanation of the procedure, risks, benefits and alternatives. The patient understands, agrees and consents for the procedure. All questions were addressed. A time out was performed. The right upper quadrant was interrogated with ultrasound. A suitable lesion in the left liver was identified. A suitable skin entry site was selected and marked. The region was then sterilely prepped and draped in standard fashion using chlorhexidine skin prep. Local anesthesia was attained by infiltration with 1% lidocaine. A small dermatotomy was made. Under real-time sonographic guidance, a 17 gauge trocar  needle was advanced into the liver and positioned at the margin of the lesion. Multiple 18 gauge core biopsies were then coaxially obtained. Needle placement was confirmed on all biopsy passes with real-time sonography. Biopsy specimens were placed in formalin and delivered to pathology for further analysis. As the introducer needle was removed, the biopsy tract was embolized with a Gel-Foam slurry. Post biopsy ultrasound imaging demonstrates no active bleeding or perihepatic hematoma. The patient tolerated the procedure well. COMPLICATIONS: None. Estimated blood loss: 0 IMPRESSION: Technically successful ultrasound-guided core biopsy of presumed metastatic liver lesion. Signed, Criselda Peaches, MD [facility] Radiology  Electronically Signed   By: Jacqulynn Cadet M.D.   On: 10/27/2015 15:53   outptatient cmet and cbc from today reviewed MDM  Patient is clinically dehydrated. I spoke with Dr.Mohammad. He suggests prescription for Medrol Dosepak which will help with patient's appetite. I had a lengthy discussion with patient and with her daughter. The plan is that hospice [facility] will interview them. Currently patient is ambulatory at home and able to get to the bathroom and get to meals which she's had no falls. Daughter is comfortable going home with her. She is currently living at her daughter's home Final diagnoses:  None   Dx dehydration     Orlie Dakin, MD 11/11/15 1041  Orlie Dakin, MD 11/11/15 1233

## 2015-11-11 NOTE — Discharge Instructions (Signed)
Dehydration, Adult It is very important to drink. Try to  drink at least six 8 ounce glasses of water each day. Hospice will make further recommendations for your care needed at home. Return if concern for any reason or contact Dr. Julien Nordmann or your primary care physician. Dehydration is a condition in which you do not have enough fluid or water in your body. It happens when you take in less fluid than you lose. Vital organs such as the kidneys, brain, and heart cannot function without a proper amount of fluids. Any loss of fluids from the body can cause dehydration.  Dehydration can range from mild to severe. This condition should be treated right away to help prevent it from becoming severe. CAUSES  This condition may be caused by:  Vomiting.  Diarrhea.  Excessive sweating, such as when exercising in hot or humid weather.  Not drinking enough fluid during strenuous exercise or during an illness.  Excessive urine output.  Fever.  Certain medicines. RISK FACTORS This condition is more likely to develop in:  People who are taking certain medicines that cause the body to lose excess fluid (diuretics).   People who have a chronic illness, such as diabetes, that may increase urination.  Older adults.   People who live at high altitudes.   People who participate in endurance sports.  SYMPTOMS  Mild Dehydration  Thirst.  Dry lips.  Slightly dry mouth.  Dry, warm skin. Moderate Dehydration  Very dry mouth.   Muscle cramps.   Dark urine and decreased urine production.   Decreased tear production.   Headache.   Light-headedness, especially when you stand up from a sitting position.  Severe Dehydration  Changes in skin.   Cold and clammy skin.   Skin does not spring back quickly when lightly pinched and released.   Changes in body fluids.   Extreme thirst.   No tears.   Not able to sweat when body temperature is high, such as in hot weather.    Minimal urine production.   Changes in vital signs.   Rapid, weak pulse (more than 100 beats per minute when you are sitting still).   Rapid breathing.   Low blood pressure.   Other changes.   Sunken eyes.   Cold hands and feet.   Confusion.  Lethargy and difficulty being awakened.  Fainting (syncope).   Short-term weight loss.   Unconsciousness. DIAGNOSIS  This condition may be diagnosed based on your symptoms. You may also have tests to determine how severe your dehydration is. These tests may include:   Urine tests.   Blood tests.  TREATMENT  Treatment for this condition depends on the severity. Mild or moderate dehydration can often be treated at home. Treatment should be started right away. Do not wait until dehydration becomes severe. Severe dehydration needs to be treated at the hospital. Treatment for Mild Dehydration  Drinking plenty of water to replace the fluid you have lost.   Replacing minerals in your blood (electrolytes) that you may have lost.  Treatment for Moderate Dehydration  Consuming oral rehydration solution (ORS). Treatment for Severe Dehydration  Receiving fluid through an IV tube.   Receiving electrolyte solution through a feeding tube that is passed through your nose and into your stomach (nasogastric tube or NG tube).  Correcting any abnormalities in electrolytes. HOME CARE INSTRUCTIONS   Drink enough fluid to keep your urine clear or pale yellow.   Drink water or fluid slowly by taking small sips. You  can also try sucking on ice cubes.  Have food or beverages that contain electrolytes. Examples include bananas and sports drinks.  Take over-the-counter and prescription medicines only as told by your health care provider.   Prepare ORS according to the manufacturer's instructions. Take sips of ORS every 5 minutes until your urine returns to normal.  If you have vomiting or diarrhea, continue to try to drink  water, ORS, or both.   If you have diarrhea, avoid:   Beverages that contain caffeine.   Fruit juice.   Milk.   Carbonated soft drinks.  Do not take salt tablets. This can lead to the condition of having too much sodium in your body (hypernatremia).  SEEK MEDICAL CARE IF:  You cannot eat or drink without vomiting.  You have had moderate diarrhea during a period of more than 24 hours.  You have a fever. SEEK IMMEDIATE MEDICAL CARE IF:   You have extreme thirst.  You have severe diarrhea.  You have not urinated in 6-8 hours, or you have urinated only a small amount of very dark urine.  You have shriveled skin.  You are dizzy, confused, or both.   This information is not intended to replace advice given to you by your health care provider. Make sure you discuss any questions you have with your health care provider.   Document Released: 10/08/2005 Document Revised: 06/29/2015 Document Reviewed: 02/23/2015 Elsevier Interactive Patient Education [employer].

## 2015-11-11 NOTE — ED Notes (Signed)
Bed: MB55 Expected date:  Expected time:  Means of arrival:  Comments: [facility]

## 2015-11-11 NOTE — ED Notes (Signed)
Tolerating POs.

## 2015-11-14 ENCOUNTER — Telehealth: Payer: Self-pay | Admitting: *Deleted

## 2015-11-14 NOTE — Telephone Encounter (Signed)
Pt sister called states pt's has been put on hospice by MD. Pt's sister has an appt with hospice at 2pm today. POF to cancel all future appts/

## 2015-11-15 ENCOUNTER — Telehealth: Payer: Self-pay | Admitting: Medical Oncology

## 2015-11-15 NOTE — Telephone Encounter (Signed)
Pt daughter, beverly,  went by hospice and requested services. Okay per [facility]. I told Ebony Hail to have their MD be attending for orders and keep [facility] informed. Will send note to fax med records to [facility] at [phone]

## 2015-11-16 ENCOUNTER — Telehealth: Payer: Self-pay | Admitting: *Deleted

## 2015-11-16 NOTE — Telephone Encounter (Signed)
Pt records faxed to [facility] at [facility].

## 2015-11-17 ENCOUNTER — Telehealth: Payer: Self-pay | Admitting: *Deleted

## 2015-11-17 NOTE — Telephone Encounter (Signed)
Received voice message from [place], [facility] and Palliative Care Nurse, stating, "please tell Dr. Julien Nordmann that patient has been admitted to Hospice services. My return number is [phone] if Dr. Julien Nordmann has any concerns or questions." Message to be given to MD.

## 2015-11-23 DEATH — deceased

## 2016-02-23 NOTE — Therapy (Addendum)
[place], [place], [address] Phone: [phone]   Fax:  [phone]  Physical Therapy Evaluation  Patient Details  Name: Katherine Pham MRN: [id number] Date of Birth: [birth year] Referring Provider: Dr. Curt Bears  Encounter Date: 10/20/2015    Past Medical History  Diagnosis Date  . Hypertension   . Diabetes mellitus   . Diverticulosis     on colon-07/31/06  . Liver metastases ([place]) 10/20/2015  . Liver dysfunction 11/11/2015  . DNR no code (do not resuscitate) 11/11/2015    Past Surgical History  Procedure Laterality Date  . Abdominal hysterectomy    . Ectopic pregnancy surgery  1965  . Cataract extraction      lt eye 1996,rt eye 1996  . Breast cyst removal  1970    bilateral benign  . Colonoscopy    . Polypectomy      There were no vitals filed for this visit.                                     Lung Clinic Goals - 10/20/15 1442    Patient will be able to verbalize understanding of the benefit of exercise to decrease fatigue.   Status Achieved   Patient will be able to verbalize the importance of posture.   Status Achieved   Patient will be able to demonstrate diaphragmatic breathing for improved lung function.   Status Achieved   Patient will be able to verbalize understanding of the role of physical therapy to prevent functional decline and who to contact if physical therapy is needed.   Status Achieved           Patient will benefit from skilled therapeutic intervention in order to improve the following deficits and impairments:  Cardiopulmonary status limiting activity  Visit Diagnosis: Muscle weakness (generalized) - Plan: PT plan of care cert/re-cert  Physical deconditioning - Plan: PT plan of care cert/re-cert     Problem List Patient Active Problem List   Diagnosis Date Noted  . Liver dysfunction 11/11/2015  . DNR no code (do  not resuscitate) 11/11/2015  . HTN (hypertension) 10/20/2015  . DM (diabetes mellitus) ([place]) 10/20/2015  . Dyslipidemia 10/20/2015  . 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 Liver metastases ([place]) 10/20/2015  . Lung mass 10/12/2015    [name] 02/23/2016, 12:41 PM  [place], [place], [address] Phone: [phone]   Fax:  [phone]  Name: Katherine Pham MRN: [id number] Date of Birth: [birth year]   [name], PT 02/23/2016 12:41 PM

## 2016-02-23 NOTE — Addendum Note (Signed)
Addended by: Jomarie Longs on: 02/23/2016 12:42 PM   Modules accepted: Orders

## 2016-07-03 IMAGING — PT NM PET TUM IMG INITIAL (PI) SKULL BASE T - THIGH
8 series · 25 of 25 positions shown · non-contrast
Comparison: None.

CLINICAL DATA: Initial Treatment strategy for lung cancer.

EXAM:
NUCLEAR MEDICINE PET SKULL BASE TO THIGH
TECHNIQUE: 6.3 mCi F-18 FDG was injected intravenously. Full-ring PET imaging
was performed from the skull base to thigh after the radiotracer. CT
data was obtained and used for attenuation correction and anatomic
localization.
FASTING BLOOD GLUCOSE:  Value: 146 mg/dl

[Series 3: pet sk_thigh ac · axial · 5.0mm · 4.07mm/px · z∈[-864,-92]mm · 4 of 194 slices shown]
[im 1/194]
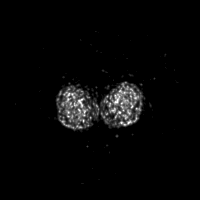
[im 65/194]
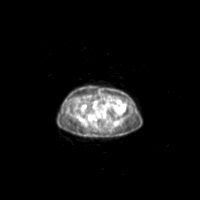
[im 129/194]
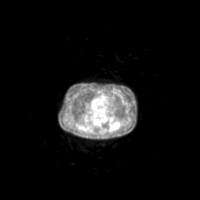
[im 194/194]
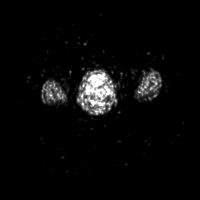

[Series 4: ct sk_thigh 5.0 b31f · axial · 5.0mm · 0.89mm/px · z∈[-864,-92]mm · 5 of 194 slices shown]
[im 1/194]
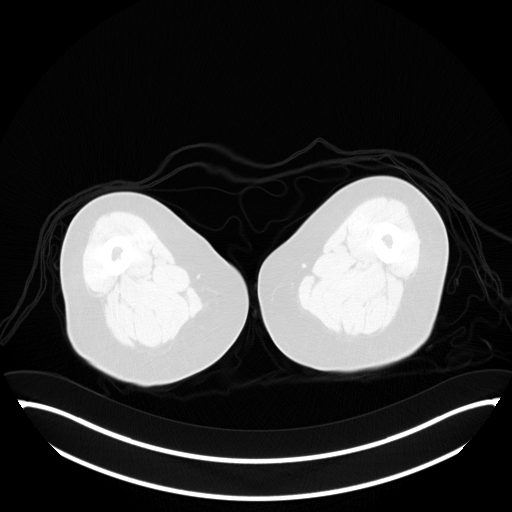
[im 49/194]
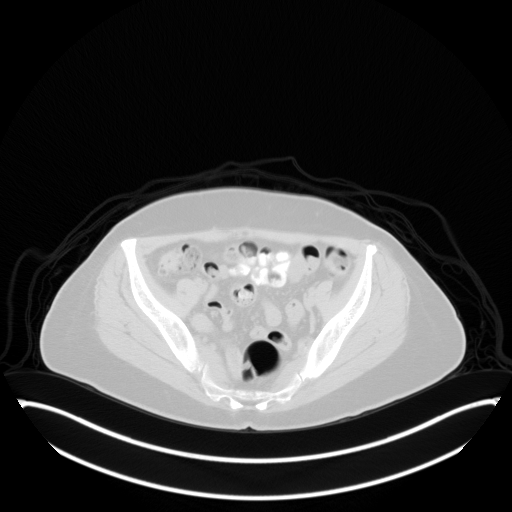
[im 97/194]
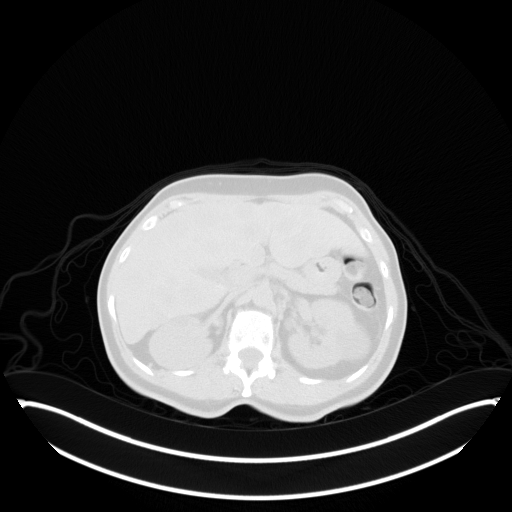
[im 145/194]
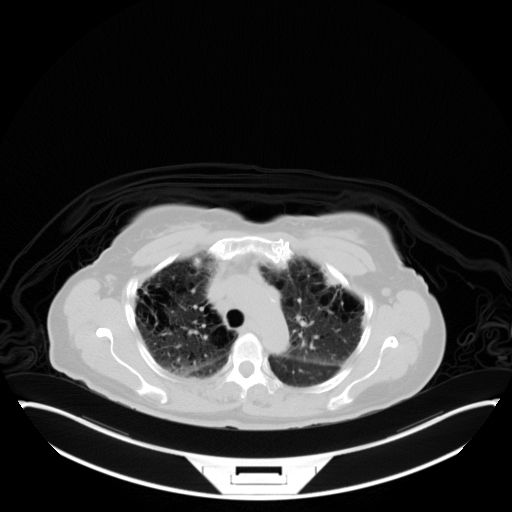
[im 194/194  brain]
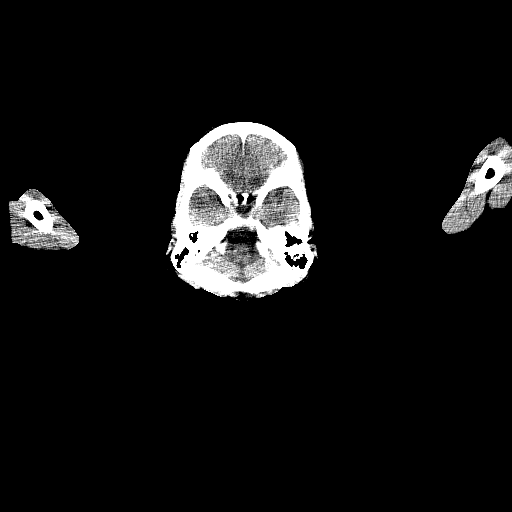

[Series 7: pet sk_thigh nac · axial · 5.0mm · 4.07mm/px · z∈[-864,-92]mm · 5 of 194 slices shown]
[im 1/194]
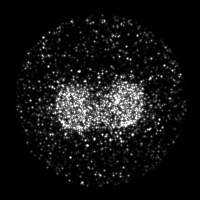
[im 49/194]
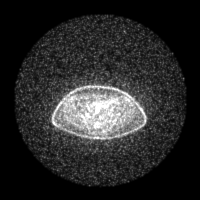
[im 97/194]
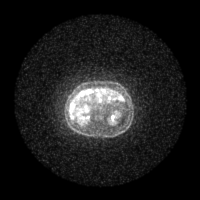
[im 145/194]
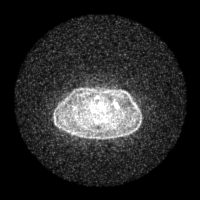
[im 194/194]
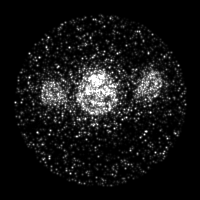

[Series 8: ct sk_thigh 5.0 b70f (id)_bone · axial · 5.0mm · 0.67mm/px · z∈[-440,-200]mm · 2 of 61 slices shown]
[im 1/61  bone]
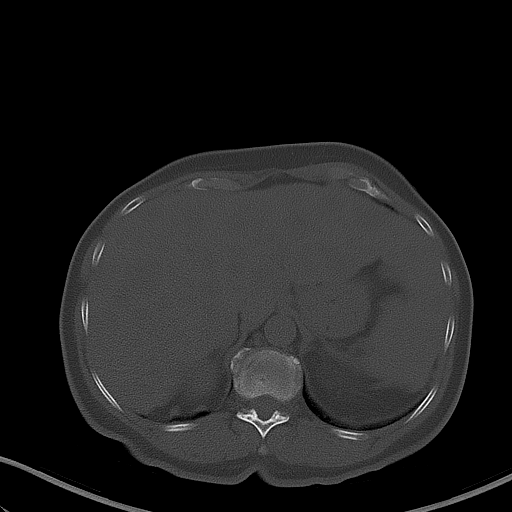
[im 61/61  bone]
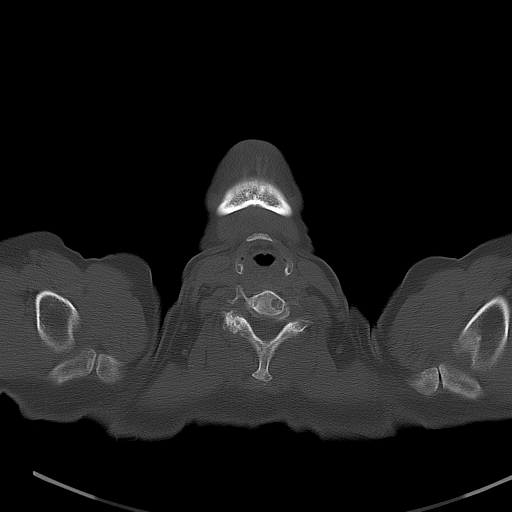

[Series 604: mip collection<mip range> · coronal · 1.68mm/px · 1 of 32 slices shown]
[im 1/32]
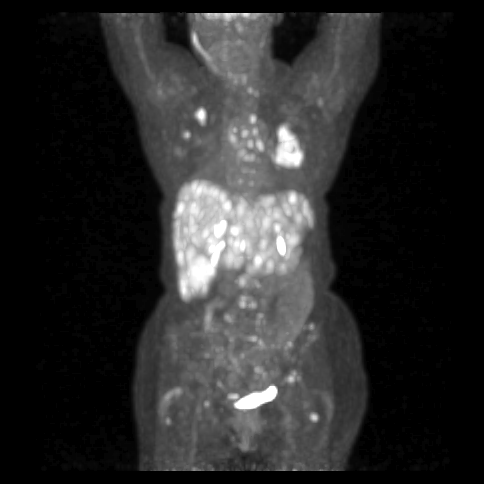

[Series 606: range-ct sk_thigh 5.0 (id)<alpha range> · 2 of 77 slices shown]
[im 1/77]
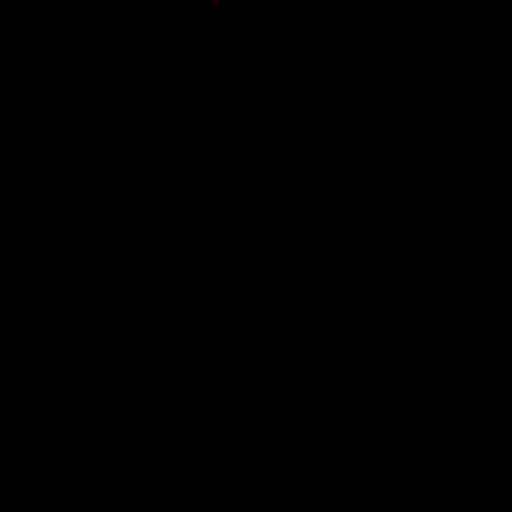
[im 77/77]
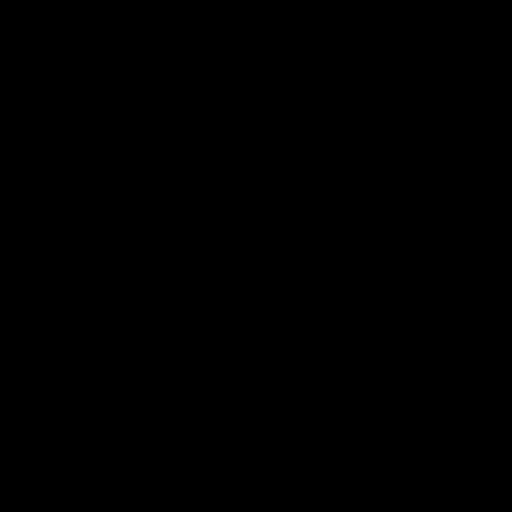

[Series 607: range-ct sk_thigh 5.0 (id)<alpha range(1)> · 5 of 183 slices shown]
[im 1/183]
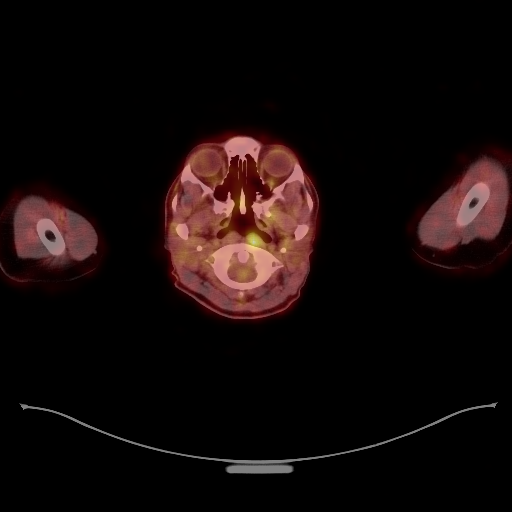
[im 46/183]
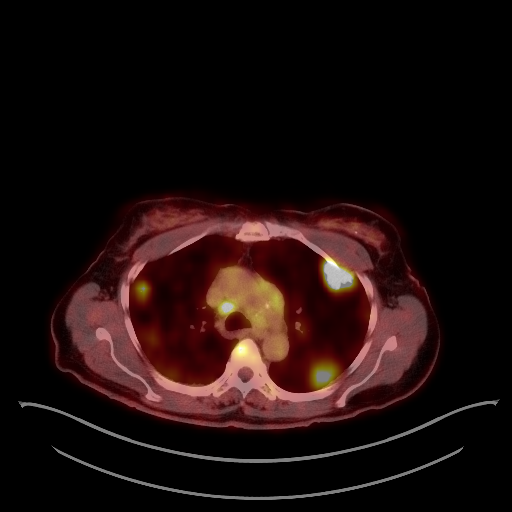
[im 92/183]
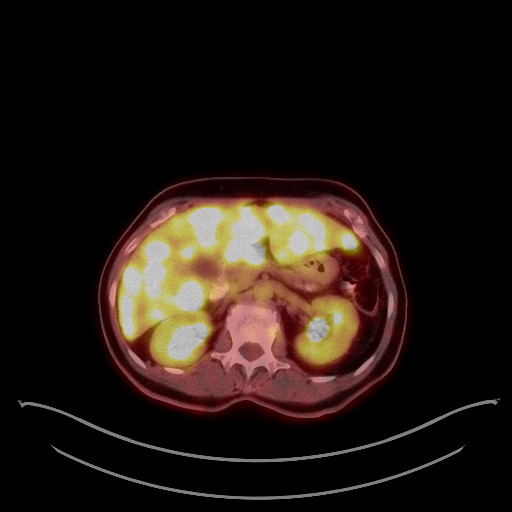
[im 137/183]
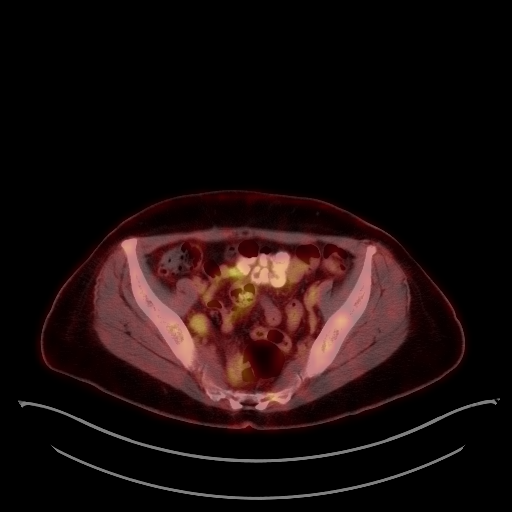
[im 183/183]
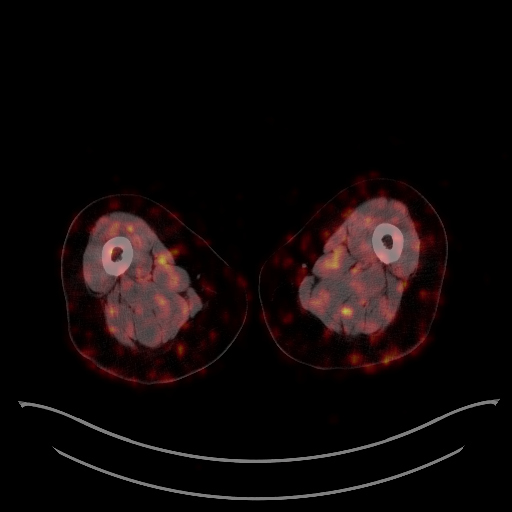

[Series 1032: results mm oncology reading · 0.91mm/px · 1 of 12 slices shown]
[im 1/12]
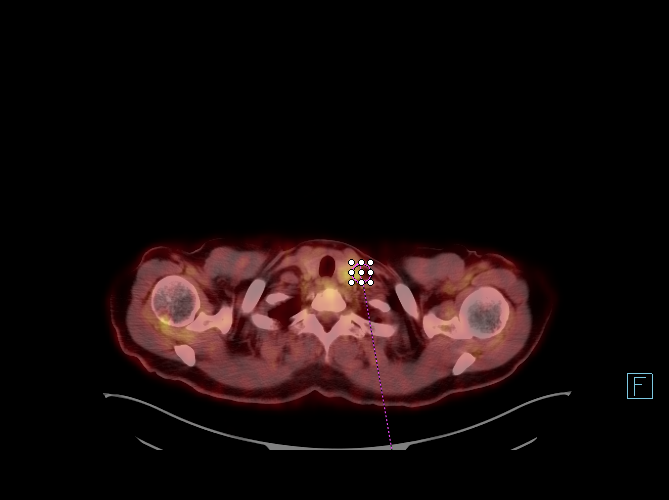

[25 of 25 positions shown; findings below may reference images not displayed]

FINDINGS: NECK

Hypermetabolic nodule within the left lobe of thyroid gland measures
9 mm and has an SUV max equal to 5.8. Adjacent hypermetabolic
supraclavicular lymph node measures 7 mm, image 34 of series 4, and
has an SUV max equal to 3.9.

CHEST

There is a mass within the left lower lobe which measures 5 cm. This
has an SUV max equal to 16.8, image 36 of series 8. Anterior left
upper lobe lung mass is hypermetabolic measuring 3.1 cm, image 21 of
series 8. SUV max is equal to 16.3. There are several hypermetabolic
nodules within the right lung. Index lesion in the right upper lobe
measures 1.6 cm, image 17 of series 8. The SUV max is equal to
18.49.

Hypermetabolic left hilar lymph node has an SUV max equal to 5.9,
image number 62. Hypermetabolic bilateral mediastinal lymph nodes
are identified. Index right paratracheal lymph node measures 11 mm
and has an SUV max equal to

ABDOMEN/PELVIS

Widespread liver metastases identified. Index lesion within the
right lobe of liver measures 2.8 cm and has an SUV max equal to
12.13. Index lesion involving the lateral segment of left lobe of
liver measures 2.5 cm and has an SUV max equal to 10.7. Index lesion
within inferior right hepatic lobe measures 4.1 cm and has an SUV
max equal to 15.4.

No abnormal uptake within the pancreas or spleen. The adrenal glands
are unremarkable. Bilateral renal cysts noted.

SKELETON

Multifocal hypermetabolic bone metastases identified. Index lesion
involving the left iliac bone measures 11 mm and has an SUV max
equal to 5.98. Index lesion involving the sacrum measures 10 mm and
has an SUV max equal to 6.97. Hypermetabolic lesion involving the L3
vertebra has an SUV max equal to 8.3.
IMPRESSION: 1. Examination is positive for hypermetabolic mass involving the
left lower lobe. There are additional hypermetabolic lesions within
the left upper lobe and right upper lobe. Bilateral mediastinal
lymph node metastasis and left supraclavicular metastasis are also
present. There is also evidence of extensive liver and bone
metastasis.
2. Hypermetabolic nodule in left lobe of thyroid gland.
Hypermetabolic thyroid nodules on PET have up to 40-50% incidence of
malignancy; recommend further evaluation with thyroid ultrasound and
possible US-guided fine needle aspiration.

## 2016-08-10 ENCOUNTER — Encounter: Payer: Self-pay | Admitting: Internal Medicine
